# Patient Record
Sex: Male | Born: 1965
Health system: Southern US, Community
[De-identification: ages and names within clinical notes are randomized; demographics above are authoritative.]

## PROBLEM LIST (undated history)

## (undated) DIAGNOSIS — E119 Type 2 diabetes mellitus without complications: Secondary | ICD-10-CM

## (undated) DIAGNOSIS — I10 Essential (primary) hypertension: Secondary | ICD-10-CM

---

## 2015-11-26 ENCOUNTER — Inpatient Hospital Stay (HOSPITAL_COMMUNITY): Payer: BLUE CROSS/BLUE SHIELD

## 2015-11-26 ENCOUNTER — Encounter (HOSPITAL_COMMUNITY): Payer: Self-pay | Admitting: Emergency Medicine

## 2015-11-26 ENCOUNTER — Inpatient Hospital Stay (HOSPITAL_COMMUNITY)
Admission: EM | Admit: 2015-11-26 | Discharge: 2015-11-28 | DRG: 440 | Disposition: A | Discharge status: Home / Self Care | Payer: BLUE CROSS/BLUE SHIELD | Attending: Internal Medicine | Admitting: Internal Medicine

## 2015-11-26 DIAGNOSIS — E0865 Diabetes mellitus due to underlying condition with hyperglycemia: Secondary | ICD-10-CM | POA: Diagnosis present

## 2015-11-26 DIAGNOSIS — K858 Other acute pancreatitis without necrosis or infection: Secondary | ICD-10-CM

## 2015-11-26 DIAGNOSIS — E1165 Type 2 diabetes mellitus with hyperglycemia: Secondary | ICD-10-CM | POA: Diagnosis present

## 2015-11-26 DIAGNOSIS — R1011 Right upper quadrant pain: Secondary | ICD-10-CM | POA: Diagnosis not present

## 2015-11-26 DIAGNOSIS — K859 Acute pancreatitis without necrosis or infection, unspecified: Secondary | ICD-10-CM | POA: Diagnosis not present

## 2015-11-26 DIAGNOSIS — K853 Drug induced acute pancreatitis without necrosis or infection: Secondary | ICD-10-CM | POA: Diagnosis not present

## 2015-11-26 HISTORY — DX: Type 2 diabetes mellitus without complications: E11.9

## 2015-11-26 LAB — COMPREHENSIVE METABOLIC PANEL
ALT: 16 U/L — AB (ref 17–63)
AST: 16 U/L (ref 15–41)
Albumin: 4.4 g/dL (ref 3.5–5.0)
Alkaline Phosphatase: 63 U/L (ref 38–126)
Anion gap: 10 (ref 5–15)
BUN: 10 mg/dL (ref 6–20)
CHLORIDE: 97 mmol/L — AB (ref 101–111)
CO2: 28 mmol/L (ref 22–32)
CREATININE: 1.11 mg/dL (ref 0.61–1.24)
Calcium: 9.6 mg/dL (ref 8.9–10.3)
GFR calc Af Amer: >60 mL/min (ref >60)
GFR calc non Af Amer: >60 mL/min (ref >60)
Glucose, Bld: 284 mg/dL — ABNORMAL HIGH (ref 65–99)
Potassium: 4.5 mmol/L (ref 3.5–5.1)
SODIUM: 135 mmol/L (ref 135–145)
Total Bilirubin: 0.9 mg/dL (ref 0.3–1.2)
Total Protein: 7.7 g/dL (ref 6.5–8.1)

## 2015-11-26 LAB — URINALYSIS, ROUTINE W REFLEX MICROSCOPIC
BILIRUBIN URINE: NEGATIVE
Glucose, UA: >1000 mg/dL — AB
HGB URINE DIPSTICK: NEGATIVE
KETONES UR: 40 mg/dL — AB
Leukocytes, UA: NEGATIVE
Nitrite: NEGATIVE
PROTEIN: NEGATIVE mg/dL
Specific Gravity, Urine: 1.024 (ref 1.005–1.030)
pH: 6 (ref 5.0–8.0)

## 2015-11-26 LAB — RAPID URINE DRUG SCREEN, HOSP PERFORMED
AMPHETAMINES: NOT DETECTED
Barbiturates: NOT DETECTED
Benzodiazepines: NOT DETECTED
Cocaine: NOT DETECTED
OPIATES: NOT DETECTED
Tetrahydrocannabinol: NOT DETECTED

## 2015-11-26 LAB — GLUCOSE, CAPILLARY
GLUCOSE-CAPILLARY: 175 mg/dL — AB (ref 65–99)
GLUCOSE-CAPILLARY: 225 mg/dL — AB (ref 65–99)

## 2015-11-26 LAB — CBC
HCT: 42.4 % (ref 39.0–52.0)
Hemoglobin: 14.6 g/dL (ref 13.0–17.0)
MCH: 28.9 pg (ref 26.0–34.0)
MCHC: 34.4 g/dL (ref 30.0–36.0)
MCV: 83.8 fL (ref 78.0–100.0)
PLATELETS: 212 10*3/uL (ref 150–400)
RBC: 5.06 MIL/uL (ref 4.22–5.81)
RDW: 12.5 % (ref 11.5–15.5)
WBC: 11.6 10*3/uL — ABNORMAL HIGH (ref 4.0–10.5)

## 2015-11-26 LAB — URINE MICROSCOPIC-ADD ON: Bacteria, UA: NONE SEEN

## 2015-11-26 LAB — LIPASE, BLOOD: LIPASE: 164 U/L — AB (ref 11–51)

## 2015-11-26 MED ORDER — HYDROMORPHONE HCL 1 MG/ML IJ SOLN
0.5000 mg | Freq: Once | INTRAMUSCULAR | Status: AC
  Administered 2015-11-26: 0.5 mg via INTRAVENOUS
  Filled 2015-11-26: qty 1

## 2015-11-26 MED ORDER — INSULIN ASPART 100 UNIT/ML ~~LOC~~ SOLN
0.0000 [IU] | SUBCUTANEOUS | Status: DC
  Administered 2015-11-26 – 2015-11-27 (×3): 5 [IU] via SUBCUTANEOUS
  Administered 2015-11-27: 2 [IU] via SUBCUTANEOUS
  Administered 2015-11-27 – 2015-11-28 (×2): 3 [IU] via SUBCUTANEOUS
  Administered 2015-11-28 (×3): 2 [IU] via SUBCUTANEOUS

## 2015-11-26 MED ORDER — SODIUM CHLORIDE 0.9 % IV SOLN
INTRAVENOUS | Status: DC
  Administered 2015-11-26 – 2015-11-28 (×5): via INTRAVENOUS

## 2015-11-26 MED ORDER — ACETAMINOPHEN 325 MG PO TABS: 650.0000 mg | ORAL_TABLET | Freq: Four times a day (QID) | ORAL | Status: DC | PRN

## 2015-11-26 MED ORDER — ONDANSETRON HCL 4 MG/2ML IJ SOLN: 4.0000 mg | Freq: Four times a day (QID) | INTRAMUSCULAR | Status: DC | PRN

## 2015-11-26 MED ORDER — ENOXAPARIN SODIUM 40 MG/0.4ML ~~LOC~~ SOLN
40.0000 mg | SUBCUTANEOUS | Status: DC
  Administered 2015-11-26 – 2015-11-27 (×2): 40 mg via SUBCUTANEOUS
  Filled 2015-11-26 (×2): qty 0.4

## 2015-11-26 MED ORDER — SODIUM CHLORIDE 0.9 % IV BOLUS (SEPSIS)
1000.0000 mL | Freq: Once | INTRAVENOUS | Status: AC
  Administered 2015-11-26: 1000 mL via INTRAVENOUS

## 2015-11-26 MED ORDER — ONDANSETRON HCL 4 MG PO TABS
4.0000 mg | ORAL_TABLET | Freq: Four times a day (QID) | ORAL | Status: DC | PRN
Start: 2015-11-26 — End: 2015-11-28

## 2015-11-26 MED ORDER — OXYCODONE HCL 5 MG PO TABS: 5.0000 mg | ORAL_TABLET | ORAL | Status: DC | PRN

## 2015-11-26 MED ORDER — IOHEXOL 300 MG/ML  SOLN: 25.0000 mL | Freq: Once | INTRAMUSCULAR | Status: DC | PRN

## 2015-11-26 MED ORDER — MORPHINE SULFATE (PF) 2 MG/ML IV SOLN
2.0000 mg | INTRAVENOUS | Status: DC | PRN
  Administered 2015-11-26: 2 mg via INTRAVENOUS
  Filled 2015-11-26: qty 1

## 2015-11-26 MED ORDER — ACETAMINOPHEN 650 MG RE SUPP: 650.0000 mg | Freq: Four times a day (QID) | RECTAL | Status: DC | PRN

## 2015-11-26 MED ORDER — ONDANSETRON HCL 4 MG/2ML IJ SOLN
4.0000 mg | Freq: Once | INTRAMUSCULAR | Status: DC
  Filled 2015-11-26: qty 2

## 2015-11-26 NOTE — ED Notes (Signed)
Pt c/o right sided abdominal pain onset Monday. Last BM was yesterday and normal. Pt reports he normally has bowel movements daily. Before yesterday last BM was Monday.

## 2015-11-26 NOTE — Progress Notes (Signed)
NURSING PROGRESS NOTE  Mike BeersDavid [place] [id number] Admission Data: 11/26/2015 7:23 PM Attending Provider: [name], MD PCP:No primary care provider on file. Code Status: FULL   [name] is a 49 y.o. male patient admitted from ED:  -No acute distress noted.  -No complaints of shortness of breath.  -No complaints of chest pain.   Cardiac Monitoring: N/A  Blood pressure 141/85, pulse 95, temperature 99.2 F (37.3 C), temperature source Oral, resp. rate 19, height 5\' 11"  (1.803 m), weight 93.895 kg (207 lb), SpO2 100 %.   IV Fluids:  IV in place, occlusive dsg intact without redness, IV cath antecubital left, condition patent and no redness normal saline.   Allergies:  Shellfish allergy and Strawberry (diagnostic)  Past Medical History:   has a past medical history of Diabetes mellitus without complication (HCC).  Past Surgical History:   has no past surgical history on file.  Social History:   reports that he has never smoked. He does not have any smokeless tobacco history on file. He reports that he does not drink alcohol or use illicit drugs.  Skin: Intact  Patient/Family orientated to room. Information packet given to patient/family. Admission inpatient armband information verified with patient/family to include name and date of birth and placed on patient arm. Side rails up x 2, fall assessment and education completed with patient/family. Patient/family able to verbalize understanding of risk associated with falls and verbalized understanding to call for assistance before getting out of bed. Call light within reach. Patient/family able to voice and demonstrate understanding of unit orientation instructions.    Will continue to evaluate and treat per MD orders.  Bennie Pieriniyndi Berdena Cisek, RN

## 2015-11-26 NOTE — H&P (Signed)
[facility] History and Physical  [name] ZOX:[id number] DOB: 1966-04-15 DOA: 11/26/2015  Referring physician:  PCP: No primary care provider on file.   Chief Complaint: Abdominal pain  HPI: [name] is a 49 y.o. male with a past medical history of diabetes mellitus presented to the emergency department with complaints of right upper quadrant pain. [name] reporting that his right side/right upper quadrant region started hurting last Monday, becoming progressively became worse over the course of the week. He states that initially his pain was intermittent however has become constant, having an intensity of 6 out of 10, characterized as crampy. He reports attending a Christmas party yesterday evening and was unable to take any by mouth secondary to nausea. He denies emesis. He complains of subjective fevers, chills, intermittent diaphoresis. His wife stating he had a temperature of 99. He reports drinking alcohol twice a month. Denies hematemesis, bright red blood per rectum, diarrhea, black stools. Workup in the emergency room revealed a lipase of 164. His oxide phosphatase, AST and ALT were not elevated.                                       Review of Systems:  Constitutional:  No weight loss, night sweats, positive for subjective Fevers, chills, fatigue.  HEENT:  No headaches, Difficulty swallowing,Tooth/dental problems,Sore throat,  No sneezing, itching, ear ache, nasal congestion, post nasal drip,  Cardio-vascular:  No chest pain, Orthopnea, PND, swelling in lower extremities, anasarca, dizziness, palpitations  GI:  No heartburn, indigestion, positive for abdominal pain, nausea, denies vomiting, diarrhea, change in bowel habits, loss of appetite  Resp:  No shortness of breath with exertion or at rest. No excess mucus, no productive cough, No non-productive cough, No coughing up of blood.No change in color of mucus.No wheezing.No chest wall deformity  Skin:  no rash  or lesions.  GU:  no dysuria, change in color of urine, no urgency or frequency. No flank pain.  Musculoskeletal:  No joint pain or swelling. No decreased range of motion. No back pain.  Psych:  No change in mood or affect. No depression or anxiety. No memory loss.   Past Medical History  Diagnosis Date  . Diabetes mellitus without complication (HCC)    History reviewed. No pertinent past surgical history. Social History:  reports that he has never smoked. He does not have any smokeless tobacco history on file. He reports that he does not drink alcohol or use illicit drugs.  Allergies  Allergen Reactions  . Shellfish Allergy Itching and Other (See Comments)    Scratchy throat  . Strawberry (Diagnostic) Itching and Other (See Comments)    Scratchy throat    Family History  noncontributory  Prior to Admission medications   Not on File   Physical Exam: Filed Vitals:   11/26/15 1423 11/26/15 1542 11/26/15 1600 11/26/15 1700  BP: 152/102 152/105 144/102 145/96  Pulse: 102 105 102 101  Temp: 99.8 F (37.7 C)     TempSrc: Oral     Resp: Height:  (1.803 m)     Weight: 93.895 kg (207 lb)     SpO2: 100% 97% 97% 98%    Wt Readings from Last 3 Encounters:  11/26/15 93.895 kg (207 lb)    General:  Appears calm and comfortable, he is currently no acute distress, awake and alert, calm cooperative, pleasant Eyes:  PERRL, normal lids, irises & conjunctiva ENT: grossly normal hearing, lips & tongue Neck: no LAD, masses or thyromegaly Cardiovascular: RRR, no m/r/g. No LE edema. Telemetry: SR, no arrhythmias  Respiratory: CTA bilaterally, no w/r/r. Normal respiratory effort. Abdomen: His abdomen is soft however he has pain with palpation over the right upper quadrant and epigastric regions. There is no peritoneal signs. No rebound tenderness, guarding, had positive bowel sounds in all 4 quadrants. Skin: no rash or induration seen on limited exam Musculoskeletal:  grossly normal tone BUE/BLE Psychiatric: grossly normal mood and affect, speech fluent and appropriate Neurologic: grossly non-focal.          Labs on Admission:  Basic Metabolic Panel:  Recent Labs Lab 11/26/15 1541  NA 135  K 4.5  CL 97*  CO2 28  GLUCOSE 284*  BUN 10  CREATININE 1.11  CALCIUM 9.6   Liver Function Tests:  Recent Labs Lab 11/26/15 1541  AST 16  ALT 16*  ALKPHOS 63  BILITOT 0.9  PROT 7.7  ALBUMIN 4.4    Recent Labs Lab 11/26/15 1541  LIPASE 164*   No results for input(s): AMMONIA in the last 168 hours. CBC:  Recent Labs Lab 11/26/15 1541  WBC 11.6*  HGB 14.6  HCT 42.4  MCV 83.8  PLT 212   Cardiac Enzymes: No results for input(s): CKTOTAL, CKMB, CKMBINDEX, TROPONINI in the last 168 hours.  BNP (last 3 results) No results for input(s): BNP in the last 8760 hours.  ProBNP (last 3 results) No results for input(s): PROBNP in the last 8760 hours.  CBG: No results for input(s): GLUCAP in the last 168 hours.  Radiological Exams on Admission: No results found.  EKG: Independently reviewed.   Assessment/Plan Principal Problem:   Pancreatitis, acute Active Problems:   Diabetes mellitus due to underlying condition with hyperglycemia (HCC)   Acute pancreatitis   1. Acute pancreatitis. [name] is a pleasant 49 year old gentleman with history of diabetes presenting with complaints of right upper quadrant abdominal pain. He reported symptoms started last Monday. Lab work in the emergency room showing an elevated lipase of 164. He denies alcohol abuse. Still has his gallbladder. Will admit him to MedSurg, make him nothing by mouth for bowel rest, provide IV fluid resuscitation and narcotic analgesia for pain management. Will check a right upper quadrant ultrasound to assess for the gallstones as etiology of his acute pancreatitis. Will also check a fasting lipid panel. Repeat lipase level, comprehensive metabolic panel and CBC in a.m.   2. Diabetes mellitus. Lab work showing elevated glucose of 284. Hyperglycemia likely related to underlying inflammatory process. He'll be made nothing by mouth thus will provide sliding scale insulin with Accu-Cheks every 4 hours. Will check a hemoglobin A1c. 3. Hypertension. Systolic blood pressures in the emergency department in the 140s, could be related to pain symptoms. Will monitor blood pressures overnight  Code Status: Full code DVT Prophylaxis: Lovenox Family Communication: I spoke to his wife and sister were present at bedside Disposition Plan: Will admit patient to the inpatient service, anticipate he will require greater than 2 nights hospitalization  Time spent: 60 min  Jeralyn BennettZAMORA, Mike Sartin [facility] Pager [phone]

## 2015-11-26 NOTE — ED Provider Notes (Signed)
I saw and evaluated the patient, reviewed the resident's note and I agree with the findings and plan.   EKG Interpretation None       Pt is a 49 year old male presenting with abdominal pain- intiitally RLQ now LLQ, diffuse. No alcohol use. No fevers. Mild nausea. .Elevated lipase. Presumed pancreatitis.  Will get official RUQ US, admit.    EMERGENCY DEPARTMENT BILIARY ULTRASOUND INTERPRETATION "Study: Limited Abdominal Ultrasound of the gallbladder and common bile duct."  INDICATIONS: RUQ pain Indication: Multiple views of the gallbladder and common bile duct were obtained in real-time with a Multi-frequency probe." PERFORMED BY:  Myself IMAGES ARCHIVED?: Yes FINDINGS: Gallstones absent, Gallbladder wall normal in thickness and Sonographic Murphy's sign present LIMITATIONS: Body Habitus INTERPRETATION: Normal, there may be a stone right in the neck.  Will get formal. \    Nakeeta Sebastiani Randall AnLyn Jestina Stephani, MD 11/29/15 [phone]

## 2015-11-26 NOTE — ED Provider Notes (Signed)
CSN: [id number]     Arrival date & time 11/26/15  1412 History   First MD Initiated Contact with Patient 11/26/15 1516     Chief Complaint  Patient presents with  . Abdominal Pain  . Constipation    HPI   Mr. Christop Hippert is a 49 year old male with PMH of T2DM who presents with 1 week history of abdominal pain. Pain began on Monday (11/20/15), located at RUQ just under the rib cage. He describes it as a cramping, dull, ache that has been constant, now 5-6/10 in severity. He also reports constipation during this time. Usually has a bowel movement daily or every other day, but did not have one until yesterday, which was solid, normal size and without bright red blood or melena. He reports his pain also is now present at the left abdomen as well. He has had nausea with small food and fluid intake, but no emesis. He denies radiation of pain to the back. He reports diaphoresis, chills, and low grade fever with temperature of 14F taken at home. He took Aspirin which alleviated his pain. He initially thought his pain was a muscle strain and tried heating pads which was not helpful. He has not tried any laxatives. He denies any prior episodes of pancreatitis, gallstones, or similar symptoms. He denies smoking or illicit drug use. He reports occasional alcohol use, maybe 1-2 times a month.   Past Medical History  Diagnosis Date  . Diabetes mellitus without complication (HCC)    History reviewed. No pertinent past surgical history. No family history on file. Social History  Substance Use Topics  . Smoking status: Never Smoker   . Smokeless tobacco: None  . Alcohol Use: No    Review of Systems  Constitutional: Positive for diaphoresis. Negative for fever.  Eyes: Negative for visual disturbance.  Respiratory: Negative for cough, chest tightness, shortness of breath and wheezing.   Cardiovascular: Negative for chest pain and palpitations.  Gastrointestinal: Positive for nausea, abdominal pain,  constipation and abdominal distention. Negative for vomiting, diarrhea and blood in stool.  Genitourinary: Negative for dysuria and hematuria.  Musculoskeletal: Negative for myalgias and arthralgias.  Skin: Negative for color change and rash.  Neurological: Negative for dizziness, light-headedness and headaches.      Allergies  Review of patient's allergies indicates no known allergies.  Home Medications   Prior to Admission medications   Not on File   BP 144/102 mmHg  Pulse 102  Temp(Src) 99.8 F (37.7 C) (Oral)  Resp 16  Ht  (1.803 m)  Wt 93.895 kg  BMI 28.88 kg/m2  SpO2 97% Physical Exam  Constitutional: He is oriented to person, place, and time. He appears well-developed and well-nourished. No distress.  HENT:  Head: Normocephalic and atraumatic.  Eyes: No scleral icterus.  Cardiovascular: Regular rhythm.  Tachycardia present.   No murmur heard. Pulmonary/Chest: Effort normal. No respiratory distress. He has no wheezes. He has no rales. He exhibits no tenderness.  Abdominal: Soft. Bowel sounds are normal.  Generalized abdominal pain, worse on left quadrants and epigastric region. Mild right-sided CVA tenderness.  Neurological: He is alert and oriented to person, place, and time.  Skin: Skin is warm.  Psychiatric: He has a normal mood and affect.    ED Course  Procedures (including critical care time) Labs Review Labs Reviewed  LIPASE, BLOOD - Abnormal; Notable for the following:    Lipase 164 (*)    All other components within normal limits  COMPREHENSIVE  METABOLIC PANEL - Abnormal; Notable for the following:    Chloride 97 (*)    Glucose, Bld 284 (*)    ALT 16 (*)    All other components within normal limits  CBC - Abnormal; Notable for the following:    WBC 11.6 (*)    All other components within normal limits  URINALYSIS, ROUTINE W REFLEX MICROSCOPIC (NOT AT [facility]) - Abnormal; Notable for the following:    Glucose, UA >1000 (*)    Ketones, ur 40  (*)    All other components within normal limits  URINE MICROSCOPIC-ADD ON - Abnormal; Notable for the following:    Squamous Epithelial / LPF 0-5 (*)    All other components within normal limits    Imaging Review No results found. I have personally reviewed and evaluated these images and lab results as part of my medical decision-making.   EKG Interpretation None      MDM   Final diagnoses:  RUQ abdominal pain   Mr. Dinah BeersDavid Linney is a 49 year old male with PMH of T2DM who presents with 1 week history of abdominal pain. Initially right-sided, now pain involves left abdomen as well. He is tender to palpation on exam. He reports a 5 day history of constipation prior to bowel movement yesterday which did not provide relief of his pain. He has a low-grade fever, mild leukocytosis of 11.6, and is hypertensive and tachycardic. Lipase returns elevated at 164. Limited Bedside U/S was done by Dr. Corlis LeakMackuen with possible stone seen in neck, normal gallbladder wall thickness, and positive Sonographic's Murphy sign. Discussed with Hospitalist who will admit for pancreatitis and official RUQ U/S ordered.    Darreld McleanVishal Myiah Petkus, MD 11/26/15 1734  Courteney Randall AnLyn Mackuen, MD 11/30/15 [id number]

## 2015-11-27 DIAGNOSIS — K853 Drug induced acute pancreatitis without necrosis or infection: Secondary | ICD-10-CM

## 2015-11-27 LAB — LIPID PANEL
CHOL/HDL RATIO: 4 ratio
Cholesterol: 136 mg/dL (ref 0–200)
HDL: 34 mg/dL — AB (ref >40)
LDL CALC: 82 mg/dL (ref 0–99)
Triglycerides: 101 mg/dL (ref <150)
VLDL: 20 mg/dL (ref 0–40)

## 2015-11-27 LAB — COMPREHENSIVE METABOLIC PANEL
ALBUMIN: 3.5 g/dL (ref 3.5–5.0)
ALK PHOS: 58 U/L (ref 38–126)
ALT: 13 U/L — ABNORMAL LOW (ref 17–63)
ANION GAP: 8 (ref 5–15)
AST: 15 U/L (ref 15–41)
BILIRUBIN TOTAL: 0.9 mg/dL (ref 0.3–1.2)
BUN: 11 mg/dL (ref 6–20)
CO2: 29 mmol/L (ref 22–32)
Calcium: 8.9 mg/dL (ref 8.9–10.3)
Chloride: 102 mmol/L (ref 101–111)
Creatinine, Ser: 1 mg/dL (ref 0.61–1.24)
GFR calc Af Amer: >60 mL/min (ref >60)
GFR calc non Af Amer: >60 mL/min (ref >60)
GLUCOSE: 112 mg/dL — AB (ref 65–99)
POTASSIUM: 3.9 mmol/L (ref 3.5–5.1)
SODIUM: 139 mmol/L (ref 135–145)
TOTAL PROTEIN: 6.7 g/dL (ref 6.5–8.1)

## 2015-11-27 LAB — CBC
HEMATOCRIT: 38.2 % — AB (ref 39.0–52.0)
HEMOGLOBIN: 12.8 g/dL — AB (ref 13.0–17.0)
MCH: 28.3 pg (ref 26.0–34.0)
MCHC: 33.5 g/dL (ref 30.0–36.0)
MCV: 84.5 fL (ref 78.0–100.0)
Platelets: 217 10*3/uL (ref 150–400)
RBC: 4.52 MIL/uL (ref 4.22–5.81)
RDW: 12.4 % (ref 11.5–15.5)
WBC: 8.9 10*3/uL (ref 4.0–10.5)

## 2015-11-27 LAB — GLUCOSE, CAPILLARY
GLUCOSE-CAPILLARY: 138 mg/dL — AB (ref 65–99)
GLUCOSE-CAPILLARY: 108 mg/dL — AB (ref 65–99)
GLUCOSE-CAPILLARY: 107 mg/dL — AB (ref 65–99)
GLUCOSE-CAPILLARY: 235 mg/dL — AB (ref 65–99)
GLUCOSE-CAPILLARY: 209 mg/dL — AB (ref 65–99)

## 2015-11-27 LAB — HEMOGLOBIN A1C
Hgb A1c MFr Bld: 9.6 % — ABNORMAL HIGH (ref 4.8–5.6)
Mean Plasma Glucose: 229 mg/dL

## 2015-11-27 LAB — LIPASE, BLOOD: Lipase: 54 U/L — ABNORMAL HIGH (ref 11–51)

## 2015-11-27 MED ORDER — METFORMIN HCL 500 MG PO TABS
1000.0000 mg | ORAL_TABLET | Freq: Two times a day (BID) | ORAL | Status: DC
Start: 2015-11-27 — End: 2015-11-27

## 2015-11-27 MED ORDER — METFORMIN HCL 500 MG PO TABS
1000.0000 mg | ORAL_TABLET | Freq: Two times a day (BID) | ORAL | Status: DC
  Administered 2015-11-27 – 2015-11-28 (×2): 1000 mg via ORAL
  Filled 2015-11-27 (×2): qty 2

## 2015-11-27 NOTE — Progress Notes (Signed)
[facility] PROGRESS NOTE  [name] ZOX:[id number] DOB: [birth year] DOA: 11/26/2015 PCP: No primary care provider on file.  Assessment/Plan: 1. Acute pancreatitis -Patient is a pleasant 49 year old gentleman with history of diabetes mellitus presented with right upper quadrant abdominal pain. Labs revealed lipase level of 164. Workup included abdominal ultrasound that only revealed trace sludge without evidence of gallstones with common bile duct measuring 3 mm. -On 11/27/2015 lab work showing improvement to his lipase level at 54. -Fasting lipid panel showing a triglyceride level of 101. -He denies out call abuse. -Suspect acute pancreatitis secondary to Januvia which has been discontinued. -Given significant clinical improvement will advance his diet to clears today.  2.  Type 2 diabetes mellitus -As mentioned above I suspicious that Januvia may have caused acute pancreatitis and has been discontinued. -Blood sugars elevated, will restart his metformin at 1000 mg by mouth twice a day.  Code Status: Full code Family Communication: Spoke to his wife Disposition Plan: Anticipate discharge in the next 14 hours if he continues to show clinical improvement    HPI/Subjective: [name] is a pleasant 49 year old gentleman with a past medical history of diabetes mellitus was admitted to medicine service on 11/26/2015 when he was in with complaints of right upper quadrant abdominal pain. Lab work in the emergency department revealed elevated lipase of 164. He was admitted for acute pancreatitis. Further workup included a right upper quadrant ultrasound that only revealed trace sludge, no evidence of gallstones with common bile duct measuring 3 mm. On the following day lipase levels coming down to 54. He reported significant improvements and denied further abdominal pain. Lipid panel revealed a triglyceride level of 101. He denied alcohol abuse. He had been on Januvia 1000 mg by mouth daily  which may have been the cause of his pancreatitis. This medication was discontinued on admission.  Objective: Filed Vitals:   11/27/15 0509 11/27/15 1333  BP: 137/83 119/72  Pulse: 96 93  Temp: 99 F (37.2 C) 99.9 F (37.7 C)  Resp: 18 20    Intake/Output Summary (Last 24 hours) at 11/27/15 1721 Last data filed at 11/27/15 1308  Gross per 24 hour  Intake 1995.84 ml  Output      0 ml  Net 1995.84 ml   Filed Weights   11/26/15 1423  Weight: 93.895 kg (207 lb)    Exam:   General:  No acute distress, patient is awake alert, states feeling much better.  Cardiovascular: Regular rate and rhythm normal S1-S2 no murmurs gallops  Respiratory: Normal respiratory effort  Abdomen: Significant improvement to abdominal examination, soft nontender nondistended  Musculoskeletal: No edema  Data Reviewed: Basic Metabolic Panel:  Recent Labs Lab 11/26/15 1541 11/27/15 0859  NA 135 139  K 4.5 3.9  CL 97* 102  CO2 28 29  GLUCOSE 284* 112*  BUN 10 11  CREATININE 1.11 1.00  CALCIUM 9.6 8.9   Liver Function Tests:  Recent Labs Lab 11/26/15 1541 11/27/15 0859  AST 16 15  ALT 16* 13*  ALKPHOS 63 58  BILITOT 0.9 0.9  PROT 7.7 6.7  ALBUMIN 4.4 3.5    Recent Labs Lab 11/26/15 1541 11/27/15 0859  LIPASE 164* 54*   No results for input(s): AMMONIA in the last 168 hours. CBC:  Recent Labs Lab 11/26/15 1541 11/27/15 0859  WBC 11.6* 8.9  HGB 14.6 12.8*  HCT 42.4 38.2*  MCV 83.8 84.5  PLT 212 217   Cardiac Enzymes: No results for input(s): CKTOTAL, CKMB, CKMBINDEX, TROPONINI  in the last 168 hours. BNP (last 3 results) No results for input(s): BNP in the last 8760 hours.  ProBNP (last 3 results) No results for input(s): PROBNP in the last 8760 hours.  CBG:  Recent Labs Lab 11/26/15 2359 11/27/15 0413 11/27/15 0801 11/27/15 1149 11/27/15 1702  GLUCAP 175* 138* 108* 107* 235*    No results found for this or any previous visit (from the past 240  hour(s)).   Studies: [place] Abdomen Complete  11/26/2015  CLINICAL DATA:  Acute pancreatitis EXAM: ULTRASOUND ABDOMEN COMPLETE COMPARISON:  None. FINDINGS: Gallbladder:  Trace sludge, with no wall thickening or tenderness Common bile duct: Diameter: 3mm Liver: No focal lesion identified. Mildly coarse echogenicity suggesting steatosis IVC: No abnormality visualized. Pancreas:  Obscured by bowel gas Spleen: Size and appearance within normal limits. Right Kidney: Length: 12cm. Echogenicity within normal limits. No mass or hydronephrosis visualized. Left Kidney: Length: 12cm. Echogenicity within normal limits. No mass or hydronephrosis visualized. Abdominal aorta: No aneurysm visualized. Other findings:  Trace ascites noted. IMPRESSION: Trace ascites.  Hepatic steatosis.  Pancreas not visualized. Electronically Signed   By: Esperanza Heiraymond  Rubner M.D.   On: 11/26/2015 21:04    Scheduled Meds: . enoxaparin (LOVENOX) injection  40 mg Subcutaneous Q24H  . insulin aspart  0-15 Units Subcutaneous 6 times per day  . metFORMIN  1,000 mg Oral BID WC   Continuous Infusions: . sodium chloride 125 mL/hr at 11/27/15 1248    Principal Problem:   Pancreatitis, acute Active Problems:   Diabetes mellitus due to underlying condition with hyperglycemia (HCC)   Acute pancreatitis    Time spent: 25 min    Jeralyn BennettZAMORA, Atasha Colebank  [facility] Pager [phone]. If 7PM-7AM, please contact night-coverage at [email or web address], password [email or web address] 11/27/2015, 5:21 PM  LOS: 1 day

## 2015-11-28 LAB — GLUCOSE, CAPILLARY
Glucose-Capillary: 124 mg/dL — ABNORMAL HIGH (ref 65–99)
Glucose-Capillary: 126 mg/dL — ABNORMAL HIGH (ref 65–99)
Glucose-Capillary: 142 mg/dL — ABNORMAL HIGH (ref 65–99)
Glucose-Capillary: 167 mg/dL — ABNORMAL HIGH (ref 65–99)

## 2015-11-28 LAB — CBC
HEMATOCRIT: 36.3 % — AB (ref 39.0–52.0)
Hemoglobin: 12.3 g/dL — ABNORMAL LOW (ref 13.0–17.0)
MCH: 28.5 pg (ref 26.0–34.0)
MCHC: 33.9 g/dL (ref 30.0–36.0)
MCV: 84.2 fL (ref 78.0–100.0)
Platelets: 187 10*3/uL (ref 150–400)
RBC: 4.31 MIL/uL (ref 4.22–5.81)
RDW: 12.3 % (ref 11.5–15.5)
WBC: 6 10*3/uL (ref 4.0–10.5)

## 2015-11-28 LAB — LIPASE, BLOOD: Lipase: 39 U/L (ref 11–51)

## 2015-11-28 MED ORDER — INSULIN GLARGINE 100 UNITS/ML SOLOSTAR PEN: 14.0000 [IU] | PEN_INJECTOR | Freq: Every day | SUBCUTANEOUS | Status: DC

## 2015-11-28 NOTE — Care Management Note (Signed)
Case Management Note  Patient Details  Name: Mike BeersDavid Gomez MRN: [id number] Date of Birth: [birth year]  Subjective/Objective:                  Date-11-28-15 Initial Assessment Spoke with patient at the bedside Introduced self as case manager and explained role in discharge planning and how to be reached.  [place]. Verified patient anticipates to go home with spouse at time of discharge.  Patient has no DME. Expressed potential need for no other DME.  Patient denied needing help with their medication.  Patient drives to MD appointments.  Verified patient has PCP [facility]. Patient states they currently receive HH services through no one.    Plan: CM will continue to follow [facility].   Lawerance Sabalebbie Rilla Buckman RN BSN CM [phone]   Action/Plan:  NO CM needs identified, will DC today to home, self care.  Expected Discharge Date:                  Expected Discharge Plan:  Home/Self Care  In-House Referral:     Discharge planning Services  CM Consult  Post Acute Care Choice:    Choice offered to:     DME Arranged:    DME Agency:     HH Arranged:    HH Agency:     Status of Service:  Completed, signed off  Medicare Important Message Given:    Date Medicare IM Given:    Medicare IM give by:    Date Additional Medicare IM Given:    Additional Medicare Important Message give by:     If discussed at Long Length of Stay Meetings, dates discussed:    Additional Comments:  Lawerance SabalDebbie Leya Paige, RN 11/28/2015, 11:39 AM

## 2015-11-28 NOTE — Discharge Summary (Addendum)
Physician Discharge Summary  [name] ZOX:[id number] DOB: [birth year] DOA: 11/26/2015  PCP: No primary care provider on file.  Admit date: 11/26/2015 Discharge date: 11/28/2015  Time spent: 35 minutes  Recommendations for Outpatient Follow-up:  1. Patient admitted for pancreatitis, suspect related to Januvia. This medication was stopped. I instructd him to increase his Lantus to 14 units and continue Metformin at 1000 mg PO BID. Please follow up on blood sugars.    Discharge Diagnoses:  Principal Problem:   Pancreatitis, acute Active Problems:   Diabetes mellitus due to underlying condition with hyperglycemia (HCC)   Acute pancreatitis   Discharge Condition: Stable  Diet recommendation: Diabetic Diet  Filed Weights   11/26/15 1423  Weight: 93.895 kg (207 lb)    History of present illness:  vid [name] is a 49 y.o. male with a past medical history of diabetes mellitus presented to the emergency department with complaints of right upper quadrant pain. [name] reporting that his right side/right upper quadrant region started hurting last Monday, becoming progressively became worse over the course of the week. He states that initially his pain was intermittent however has become constant, having an intensity of 6 out of 10, characterized as crampy. He reports attending a Christmas party yesterday evening and was unable to take any by mouth secondary to nausea. He denies emesis. He complains of subjective fevers, chills, intermittent diaphoresis. His wife stating he had a temperature of 99. He reports drinking alcohol twice a month. Denies hematemesis, bright red blood per rectum, diarrhea, black stools. Workup in the emergency room revealed a lipase of 164. His oxide phosphatase, AST and ALT were not elevated.  Hospital Course:  [name] is a pleasant 49 year old gentleman with a past medical history of diabetes mellitus was admitted to medicine service on 11/26/2015  when he was in with complaints of right upper quadrant abdominal pain. Lab work in the emergency department revealed elevated lipase of 164. He was admitted for acute pancreatitis. Further workup included a right upper quadrant ultrasound that only revealed trace sludge, no evidence of gallstones with common bile duct measuring 3 mm. On the following day lipase levels coming down to 54. He reported significant improvements and denied further abdominal pain. Lipid panel revealed a triglyceride level of 101. He denied alcohol abuse. He had been on Januvia 1000 mg by mouth daily which may have been the cause of his pancreatitis. This medication was discontinued on admission. On 11/28/2015 his lipase level normalized to 39. He was tolerating regular diet and reported resolution to his abdominal pain. He was discharged home on 11/28/2015. I instructed him to stop Januvia and increase Lantus to 14 units Benoit q hs. Please follow up on blood sugars.    Discharge Exam: Filed Vitals:   11/27/15 2154 11/28/15 0525  BP: 123/60 128/76  Pulse: 95 77  Temp: 99.3 F (37.4 C) 98.9 F (37.2 C)  Resp: 18 18     General: No acute distress, patient is awake alert, wants to go home  Cardiovascular: Regular rate and rhythm normal S1-S2 no murmurs gallops  Respiratory: Normal respiratory effort  Abdomen: Significant improvement to abdominal examination, soft nontender nondistended  Musculoskeletal: No edema  Discharge Instructions   Discharge Instructions    Call MD for:  difficulty breathing, headache or visual disturbances    Complete by:  As directed      Call MD for:  extreme fatigue    Complete by:  As directed      Call  MD for:  hives    Complete by:  As directed      Call MD for:  persistant dizziness or light-headedness    Complete by:  As directed      Call MD for:  persistant nausea and vomiting    Complete by:  As directed      Call MD for:  redness, tenderness, or signs of infection (pain,  swelling, redness, odor or green/yellow discharge around incision site)    Complete by:  As directed      Call MD for:  severe uncontrolled pain    Complete by:  As directed      Call MD for:  temperature >100.4    Complete by:  As directed      Call MD for:    Complete by:  As directed      Diet - low sodium heart healthy    Complete by:  As directed      Increase activity slowly    Complete by:  As directed           Current Discharge Medication List    CONTINUE these medications which have CHANGED   Details  insulin glargine (LANTUS) 100 unit/mL SOPN Inject 0.14 mLs (14 Units total) into the skin at bedtime. Qty: 15 mL, Refills: 11      CONTINUE these medications which have NOT CHANGED   Details  aspirin EC 81 MG tablet Take 81 mg by mouth daily.    glipiZIDE (GLUCOTROL) 10 MG tablet Take 20 mg by mouth daily before supper.    lisinopril (PRINIVIL,ZESTRIL) 10 MG tablet Take 10 mg by mouth daily.    metFORMIN (GLUCOPHAGE) 1000 MG tablet Take 1,000 mg by mouth 2 (two) times daily with a meal.    Multiple Vitamin (MULTIVITAMIN WITH MINERALS) TABS tablet Take 1 tablet by mouth daily at 12 noon.      STOP taking these medications     Aspirin-Caffeine (ANACIN PO)      sitaGLIPtin (JANUVIA) 100 MG tablet        Allergies  Allergen Reactions  . Shellfish Allergy Itching and Other (See Comments)    Scratchy throat  . Strawberry (Diagnostic) Itching and Other (See Comments)    Scratchy throat   Follow-up Information    Follow up with POLITE,RONALD D, MD In 2 weeks.   Specialty:  Internal Medicine   Contact information:   [address] [place] [address] [phone]        The results of significant diagnostics from this hospitalization (including imaging, microbiology, ancillary and laboratory) are listed below for reference.    Significant Diagnostic Studies: US Abdomen Complete  11/26/2015  CLINICAL DATA:  Acute pancreatitis EXAM:  ULTRASOUND ABDOMEN COMPLETE COMPARISON:  None. FINDINGS: Gallbladder:  Trace sludge, with no wall thickening or tenderness Common bile duct: Diameter: 3mm Liver: No focal lesion identified. Mildly coarse echogenicity suggesting steatosis IVC: No abnormality visualized. Pancreas:  Obscured by bowel gas Spleen: Size and appearance within normal limits. Right Kidney: Length: 12cm. Echogenicity within normal limits. No mass or hydronephrosis visualized. Left Kidney: Length: 12cm. Echogenicity within normal limits. No mass or hydronephrosis visualized. Abdominal aorta: No aneurysm visualized. Other findings:  Trace ascites noted. IMPRESSION: Trace ascites.  Hepatic steatosis.  Pancreas not visualized. Electronically Signed   By: Esperanza Heir M.D.   On: 11/26/2015 21:04    Microbiology: No results found for this or any previous visit (from the past 240 hour(s)).  Labs: Basic Metabolic Panel:  Recent Labs Lab 11/26/15 1541 11/27/15 0859  NA 135 139  K 4.5 3.9  CL 97* 102  CO2 28 29  GLUCOSE 284* 112*  BUN 10 11  CREATININE 1.11 1.00  CALCIUM 9.6 8.9   Liver Function Tests:  Recent Labs Lab 11/26/15 1541 11/27/15 0859  AST 16 15  ALT 16* 13*  ALKPHOS 63 58  BILITOT 0.9 0.9  PROT 7.7 6.7  ALBUMIN 4.4 3.5    Recent Labs Lab 11/26/15 1541 11/27/15 0859 11/28/15 0656  LIPASE 164* 54* 39   No results for input(s): AMMONIA in the last 168 hours. CBC:  Recent Labs Lab 11/26/15 1541 11/27/15 0859 11/28/15 0656  WBC 11.6* 8.9 6.0  HGB 14.6 12.8* 12.3*  HCT 42.4 38.2* 36.3*  MCV 83.8 84.5 84.2  PLT 212 217 187   Cardiac Enzymes: No results for input(s): CKTOTAL, CKMB, CKMBINDEX, TROPONINI in the last 168 hours. BNP: BNP (last 3 results) No results for input(s): BNP in the last 8760 hours.  ProBNP (last 3 results) No results for input(s): PROBNP in the last 8760 hours.  CBG:  Recent Labs Lab 11/27/15 1702 11/27/15 2021 11/28/15 0012 11/28/15 0428  11/28/15 0814  GLUCAP 235* 209* 142* 126* 124*       Signed:  Jeralyn BennettZAMORA, Waldron Gerry  [facility] 11/28/2015, 11:13 AM

## 2015-11-28 NOTE — Progress Notes (Signed)
RN reviewed and given patient discharge instruction using teach back. Pt had no questions or concerns when asked. Pt is dressing and sitting up in chair eating lunch at this time. Wife in room gathering pt's belongings. RN removed IV site with catheter tip intact. Dressing in place. No signs or symptoms of infection or bleeding noted. Pt was educated to call RN when pt is ready for wheelchair escort. Call bell and personal belonging within reach.

## 2016-11-11 ENCOUNTER — Encounter (HOSPITAL_COMMUNITY): Payer: Self-pay

## 2016-11-11 ENCOUNTER — Emergency Department (HOSPITAL_COMMUNITY)
Admission: EM | Admit: 2016-11-11 | Discharge: 2016-11-12 | Disposition: A | Discharge status: Home / Self Care | Payer: BLUE CROSS/BLUE SHIELD | Attending: Emergency Medicine | Admitting: Emergency Medicine

## 2016-11-11 DIAGNOSIS — Z7982 Long term (current) use of aspirin: Secondary | ICD-10-CM | POA: Diagnosis not present

## 2016-11-11 DIAGNOSIS — Y929 Unspecified place or not applicable: Secondary | ICD-10-CM | POA: Insufficient documentation

## 2016-11-11 DIAGNOSIS — Y999 Unspecified external cause status: Secondary | ICD-10-CM | POA: Insufficient documentation

## 2016-11-11 DIAGNOSIS — Z79899 Other long term (current) drug therapy: Secondary | ICD-10-CM | POA: Diagnosis not present

## 2016-11-11 DIAGNOSIS — X58XXXA Exposure to other specified factors, initial encounter: Secondary | ICD-10-CM | POA: Diagnosis not present

## 2016-11-11 DIAGNOSIS — T162XXA Foreign body in left ear, initial encounter: Secondary | ICD-10-CM | POA: Insufficient documentation

## 2016-11-11 DIAGNOSIS — Y939 Activity, unspecified: Secondary | ICD-10-CM | POA: Diagnosis not present

## 2016-11-11 DIAGNOSIS — I10 Essential (primary) hypertension: Secondary | ICD-10-CM | POA: Insufficient documentation

## 2016-11-11 DIAGNOSIS — E119 Type 2 diabetes mellitus without complications: Secondary | ICD-10-CM | POA: Insufficient documentation

## 2016-11-11 DIAGNOSIS — Z794 Long term (current) use of insulin: Secondary | ICD-10-CM | POA: Insufficient documentation

## 2016-11-11 HISTORY — DX: Essential (primary) hypertension: I10

## 2016-11-11 NOTE — ED Provider Notes (Signed)
[facility]-EMERGENCY DEPT Provider Note   CSN: [id number] Arrival date & time: 11/11/16  2241  By signing my name below, I, [name], attest that this documentation has been prepared under the direction and in the presence of [name], PA-C.  Electronically Signed: Rosario AdieWilliam Andrew Gomez, ED Scribe. 11/11/16. 11:56 PM.  History   Chief Complaint Chief Complaint  Patient presents with  . bug in ear   The history is provided by the patient and the spouse. No language interpreter was used.    HPI Comments: [name] is a 50 y.o. male with a PMHx of DM and HTN, who presents to the Emergency Department complaining of persistent sensation of foreign body to the left ear w/ associated ear pain which began last night. Pt reports that he has felt intermittent "crackling" sensation to the ear since onset, and tonight his wife looked into his ear and saw "something moving". His wife attempted to extract the foreign body prior to coming into the ED without success. He denies fever, congestion, rhinorrhea, or any other associated symptoms.    Past Medical History:  Diagnosis Date  . Diabetes mellitus without complication (HCC)   . Hypertension    Patient Active Problem List   Diagnosis Date Noted  . Pancreatitis, acute 11/26/2015  . Diabetes mellitus due to underlying condition with hyperglycemia (HCC) 11/26/2015  . Acute pancreatitis 11/26/2015   History reviewed. No pertinent surgical history.  Home Medications    Prior to Admission medications   Medication Sig Start Date End Date Taking? Authorizing Provider  aspirin EC 81 MG tablet Take 81 mg by mouth daily.    Historical Provider, MD  glipiZIDE (GLUCOTROL) 10 MG tablet Take 20 mg by mouth daily before supper.    Historical Provider, MD  insulin glargine (LANTUS) 100 unit/mL SOPN Inject 0.14 mLs (14 Units total) into the skin at bedtime. 11/28/15   [name], MD  lisinopril (PRINIVIL,ZESTRIL) 10 MG tablet Take 10 mg by  mouth daily.    Historical Provider, MD  metFORMIN (GLUCOPHAGE) 1000 MG tablet Take 1,000 mg by mouth 2 (two) times daily with a meal.    Historical Provider, MD  Multiple Vitamin (MULTIVITAMIN WITH MINERALS) TABS tablet Take 1 tablet by mouth daily at 12 noon.    Historical Provider, MD  ofloxacin (FLOXIN) 0.3 % otic solution Place 10 drops into the left ear 2 (two) times daily. 11/12/16   [name], PA-C   Family History History reviewed. No pertinent family history.  Social History Social History  Substance Use Topics  . Smoking status: Never Smoker  . Smokeless tobacco: Never Used  . Alcohol use No   Allergies   Shellfish allergy and Strawberry (diagnostic)  Review of Systems Review of Systems  Constitutional: Negative for fever.  HENT: Positive for ear pain. Negative for congestion and rhinorrhea.    Physical Exam Updated Vital Signs BP 161/92 (BP Location: Left Arm)   Pulse 83   Temp 97.5 F (36.4 C) (Oral)   Resp 18   Ht 5\' 11"  (1.803 m)   Wt 210 lb (95.3 kg)   SpO2 100%   BMI 29.29 kg/m   Physical Exam  Constitutional: He appears well-developed and well-nourished. No distress.  HENT:  Head: Normocephalic and atraumatic.  Right Ear: Tympanic membrane, external ear and ear canal normal.  Left Ear: A foreign body is present. Tympanic membrane is perforated. Tympanic membrane is not injected. No hemotympanum.  Small perforation at 7 o'clock position of  left TM. Live insect at base of left external auditory canal,   Eyes: Conjunctivae and EOM are normal. Pupils are equal, round, and reactive to light. Right eye exhibits no discharge. Left eye exhibits no discharge. No scleral icterus.  Neck: Normal range of motion.  Pulmonary/Chest: Effort normal. No respiratory distress.  Abdominal: He exhibits no distension.  Neurological: He is alert.  Skin: Skin is warm and dry. He is not diaphoretic.  Psychiatric: He has a normal mood and affect. His behavior is  normal.   ED Treatments / Results  DIAGNOSTIC STUDIES: Oxygen Saturation is 100% on RA, normal by my interpretation.   COORDINATION OF CARE: 11:56 PM-Discussed next steps with pt. Pt verbalized understanding and is agreeable with the plan.   Labs (all labs ordered are listed, but only abnormal results are displayed) Labs Reviewed - No data to display  EKG  EKG Interpretation None      Radiology No results found.  Procedures Procedures   Medications Ordered in ED Medications  lidocaine (PF) (XYLOCAINE) 1 % injection 5 mL (5 mLs Other Given 11/12/16 0054)    Initial Impression / Assessment and Plan / ED Course  I have reviewed the triage vital signs and the nursing notes.  Pertinent labs & imaging results that were available during my care of the patient were reviewed by me and considered in my medical decision making (see chart for details).  Clinical Course    Patient presents to ED with complaint of foreign body in ear. Patient is afebrile and non-toxic appearing in NAD. VSS. Live insect noted in left external auditory canal with small TM perforation at 7 o'clock position. Shared visit with Dr. Blinda LeatherwoodPollina. Manual removal with alligator forceps attempted by Dr. Blinda LeatherwoodPollina. Lidocaine was then placed in ear canal and insect walked out of ear canal. Bleeding noted to external auditory canal following. Given small TM perforation will place pt on topical otic ABX ofloxacin. Encouraged follow up with PCP or ENT for re-evaluation in 2-3 days. Return precautions given. Pt voiced understanding and is agreeable.   Final Clinical Impressions(s) / ED Diagnoses   Final diagnoses:  Foreign body of left ear, initial encounter   New Prescriptions Discharge Medication List as of 11/12/2016  1:33 AM    START taking these medications   Details  ofloxacin (FLOXIN) 0.3 % otic solution Place 10 drops into the left ear 2 (two) times daily., Starting Tue 11/12/2016, Print       I personally  performed the services described in this documentation, which was scribed in my presence. The recorded information has been reviewed and is accurate.     [name], PA-C 11/12/16 0220    Gilda Creasehristopher J Pollina, MD 11/12/16 [phone]

## 2016-11-11 NOTE — ED Triage Notes (Signed)
Pt states felt like something was in L ear. Pt states wife looked into ear and saw a bug.

## 2016-11-12 MED ORDER — LIDOCAINE HCL (PF) 1 % IJ SOLN
5.0000 mL | Freq: Once | INTRAMUSCULAR | Status: AC
  Administered 2016-11-12: 5 mL
  Filled 2016-11-12: qty 5

## 2016-11-12 MED ORDER — OFLOXACIN 0.3 % OT SOLN: 10.0000 [drp] | Freq: Two times a day (BID) | OTIC | 0 refills | Status: DC

## 2016-11-12 NOTE — ED Notes (Signed)
After placing lidocaine in pt ear pt has a bug (earwick) come out.

## 2016-11-12 NOTE — Discharge Instructions (Signed)
Read the information below.  An earwig was removed from your ear. There appears to be a small perforation of your ear drum. You are being placed on topical antibiotic drops. Please use as directed.  You can take tylenol or motrin for pain relief.  Please follow up with your primary provider or ear, nose, and throat (ENT). doctor in the next few days for re-evaluation. I have provided the contact information for ENT.  Use the prescribed medication as directed.  Please discuss all new medications with your pharmacist.   You may return to the Emergency Department at any time for worsening condition or any new symptoms that concern you.

## 2016-11-13 ENCOUNTER — Other Ambulatory Visit: Payer: Self-pay | Admitting: Gastroenterology

## 2017-01-13 ENCOUNTER — Ambulatory Visit (HOSPITAL_COMMUNITY)
Admission: RE | Admit: 2017-01-13 | Discharge: 2017-01-13 | Disposition: A | Discharge status: Home / Self Care | Payer: BLUE CROSS/BLUE SHIELD | Source: Ambulatory Visit | Attending: Gastroenterology | Admitting: Gastroenterology

## 2017-01-13 ENCOUNTER — Ambulatory Visit (HOSPITAL_COMMUNITY): Payer: BLUE CROSS/BLUE SHIELD | Admitting: Anesthesiology

## 2017-01-13 ENCOUNTER — Encounter (HOSPITAL_COMMUNITY): Payer: Self-pay | Admitting: *Deleted

## 2017-01-13 ENCOUNTER — Encounter (HOSPITAL_COMMUNITY)
Admission: RE | Disposition: A | Discharge status: Home / Self Care | Payer: Self-pay | Source: Ambulatory Visit | Attending: Gastroenterology

## 2017-01-13 DIAGNOSIS — I10 Essential (primary) hypertension: Secondary | ICD-10-CM | POA: Insufficient documentation

## 2017-01-13 DIAGNOSIS — Z1211 Encounter for screening for malignant neoplasm of colon: Secondary | ICD-10-CM | POA: Diagnosis present

## 2017-01-13 DIAGNOSIS — E119 Type 2 diabetes mellitus without complications: Secondary | ICD-10-CM | POA: Diagnosis not present

## 2017-01-13 DIAGNOSIS — E7800 Pure hypercholesterolemia, unspecified: Secondary | ICD-10-CM | POA: Insufficient documentation

## 2017-01-13 DIAGNOSIS — K859 Acute pancreatitis without necrosis or infection, unspecified: Secondary | ICD-10-CM | POA: Diagnosis not present

## 2017-01-13 DIAGNOSIS — Z794 Long term (current) use of insulin: Secondary | ICD-10-CM | POA: Insufficient documentation

## 2017-01-13 HISTORY — PX: COLONOSCOPY WITH PROPOFOL: SHX5780

## 2017-01-13 LAB — GLUCOSE, CAPILLARY: Glucose-Capillary: 184 mg/dL — ABNORMAL HIGH (ref 65–99)

## 2017-01-13 SURGERY — COLONOSCOPY WITH PROPOFOL
Anesthesia: Monitor Anesthesia Care

## 2017-01-13 MED ORDER — PROPOFOL 500 MG/50ML IV EMUL
INTRAVENOUS | Status: DC | PRN
  Administered 2017-01-13: 100 ug/kg/min via INTRAVENOUS

## 2017-01-13 MED ORDER — SODIUM CHLORIDE 0.9 % IV SOLN: INTRAVENOUS | Status: DC

## 2017-01-13 MED ORDER — PROPOFOL 10 MG/ML IV BOLUS
INTRAVENOUS | Status: DC | PRN
  Administered 2017-01-13 (×2): 20 mg via INTRAVENOUS
  Administered 2017-01-13: 50 mg via INTRAVENOUS

## 2017-01-13 MED ORDER — LACTATED RINGERS IV SOLN
INTRAVENOUS | Status: DC
  Administered 2017-01-13: 1000 mL via INTRAVENOUS

## 2017-01-13 MED ORDER — PROPOFOL 10 MG/ML IV BOLUS
INTRAVENOUS | Status: AC
  Filled 2017-01-13: qty 20

## 2017-01-13 MED ORDER — LIDOCAINE 2% (20 MG/ML) 5 ML SYRINGE
INTRAMUSCULAR | Status: AC
  Filled 2017-01-13: qty 5

## 2017-01-13 MED ORDER — LIDOCAINE 2% (20 MG/ML) 5 ML SYRINGE
INTRAMUSCULAR | Status: DC | PRN
  Administered 2017-01-13: 100 mg via INTRAVENOUS

## 2017-01-13 MED ORDER — PROPOFOL 10 MG/ML IV BOLUS
INTRAVENOUS | Status: AC
  Filled 2017-01-13: qty 40

## 2017-01-13 SURGICAL SUPPLY — 21 items

## 2017-01-13 NOTE — Discharge Instructions (Signed)

## 2017-01-13 NOTE — H&P (Signed)
Procedure: Baseline screening colonoscopy  History: The patient is a 51 -year-old male born Apr 21, 1966. He is scheduled to undergo his first screening colonoscopy with polypectomy to prevent colon cancer.  Medication allergies: Januvia caused pancreatitis  Past medical history: Type 2 diabetes mellitus. Hypertension. Hypercholesterolemia. Acute pancreatitis due to Januvia.  Exam: The patient is alert and lying comfortably on the endoscopy stretcher. Abdomen is soft and nontender to palpation. Lungs are clear to auscultation. Cardiac exam reveals a regular rhythm.  Plan: Proceed with screening colonoscopy

## 2017-01-13 NOTE — Anesthesia Postprocedure Evaluation (Addendum)
Anesthesia Post Note  Patient: [name]  Procedure(s) Performed: Procedure(s) (LRB): COLONOSCOPY WITH PROPOFOL (N/A)  Patient location during evaluation: Endoscopy Anesthesia Type: MAC Level of consciousness: awake and alert Pain management: pain level controlled Vital Signs Assessment: post-procedure vital signs reviewed and stable Respiratory status: spontaneous breathing, nonlabored ventilation, respiratory function stable and patient connected to nasal cannula oxygen Cardiovascular status: stable and blood pressure returned to baseline Anesthetic complications: no       Last Vitals:  Vitals:   01/13/17 1353 01/13/17 1420  BP: 112/67 (!) 150/89  Pulse: 70   Resp: 17   Temp: 36.7 C     Last Pain:  Vitals:   01/13/17 1353  TempSrc: Oral                 Salena Ortlieb,JAMES TERRILL

## 2017-01-13 NOTE — Op Note (Signed)
[facility] Patient Name: [name] Procedure Date: 01/13/2017 MRN: [id number] Attending MD: Charolett Bumpers , MD Date of Birth: June 27, 1966 CSN: [id number] Age: 51 Admit Type: Outpatient Procedure:                Colonoscopy Indications:              Screening for colorectal malignant neoplasm Providers:                Charolett Bumpers, MD, Tillie Fantasia, RN, Darletta Moll Tech, Technician, Randon Goldsmith, CRNA Referring MD:              Medicines:                Propofol per Anesthesia Complications:            No immediate complications. Estimated Blood Loss:     Estimated blood loss: none. Procedure:                Pre-Anesthesia Assessment:                           - Prior to the procedure, a History and Physical                            was performed, and patient medications and                            allergies were reviewed. The patient's tolerance of                            previous anesthesia was also reviewed. The risks                            and benefits of the procedure and the sedation                            options and risks were discussed with the patient.                            All questions were answered, and informed consent                            was obtained. Prior Anticoagulants: The patient has                            taken aspirin, last dose was day of procedure. ASA                            Grade Assessment: II - A patient with mild systemic                            disease. After reviewing the risks and benefits,  the patient was deemed in satisfactory condition to                            undergo the procedure.                           After obtaining informed consent, the colonoscope                            was passed under direct vision. Throughout the                            procedure, the patient's blood pressure, pulse, and             oxygen saturations were monitored continuously. The                            [id number] ([id number] scope was introduced through                            the anus and advanced to the the cecum, identified                            by appendiceal orifice and ileocecal valve. The                            colonoscopy was performed without difficulty. The                            patient tolerated the procedure well. The quality                            of the bowel preparation was good. The terminal                            ileum, the ileocecal valve, the appendiceal orifice                            and the rectum were photographed. Scope In: 1:30:20 PM Scope Out: 1:46:36 PM Scope Withdrawal Time: 0 hours 8 minutes 7 seconds  Total Procedure Duration: 0 hours 16 minutes 16 seconds  Findings:      The perianal and digital rectal examinations were normal.      The entire examined colon appeared normal. Impression:               - The entire examined colon is normal.                           - No specimens collected. Moderate Sedation:      N/A- Per Anesthesia Care Recommendation:           - Patient has a contact number available for                            emergencies. The signs and symptoms of potential  delayed complications were discussed with the                            patient. Return to normal activities tomorrow.                            Written discharge instructions were provided to the                            patient.                           - Repeat colonoscopy in 10 years for screening                            purposes.                           - Resume previous diet.                           - Continue present medications. Procedure Code(s):        --- Professional ---                           Z6109, Colorectal cancer screening; colonoscopy on                            individual not meeting criteria for high  risk Diagnosis Code(s):        --- Professional ---                           Z12.11, Encounter for screening for malignant                            neoplasm of colon CPT copyright 2016 [employer]. All rights reserved. The codes documented in this report are preliminary and upon coder review may  be revised to meet current compliance requirements. [name], MD Charolett Bumpers, MD 01/13/2017 1:51:26 PM This report has been signed electronically. Number of Addenda: 0

## 2017-01-13 NOTE — Anesthesia Preprocedure Evaluation (Addendum)
Anesthesia Evaluation  Patient identified by MRN, date of birth, ID band Patient awake    Reviewed: Allergy & Precautions, NPO status , Patient's Chart, lab work & pertinent test results  Airway Mallampati: I       Dental  (+) Teeth Intact   Pulmonary neg pulmonary ROS,    breath sounds clear to auscultation       Cardiovascular hypertension, negative cardio ROS   Rhythm:Regular Rate:Normal     Neuro/Psych negative neurological ROS  negative psych ROS   GI/Hepatic negative GI ROS, Neg liver ROS,   Endo/Other  negative endocrine ROSdiabetes, Well Controlled, Type 1, Insulin Dependent  Renal/GU negative Renal ROS  negative genitourinary   Musculoskeletal negative musculoskeletal ROS (+)   Abdominal   Peds negative pediatric ROS (+)  Hematology negative hematology ROS (+)   Anesthesia Other Findings   Reproductive/Obstetrics negative OB ROS                            Anesthesia Physical Anesthesia Plan  ASA: III  Anesthesia Plan: MAC   Post-op Pain Management:    Induction: Intravenous  Airway Management Planned: Natural Airway and Nasal Cannula  Additional Equipment:   Intra-op Plan:   Post-operative Plan:   Informed Consent: I have reviewed the patients History and Physical, chart, labs and discussed the procedure including the risks, benefits and alternatives for the proposed anesthesia with the patient or authorized representative who has indicated his/her understanding and acceptance.   Dental advisory given  Plan Discussed with:   Anesthesia Plan Comments:        Anesthesia Quick Evaluation

## 2017-01-13 NOTE — Transfer of Care (Signed)
Immediate Anesthesia Transfer of Care Note  Patient: [name]  Procedure(s) Performed: Procedure(s): COLONOSCOPY WITH PROPOFOL (N/A)  Patient Location: PACU  Anesthesia Type:MAC  Level of Consciousness: Patient easily awoken, sedated, comfortable, cooperative, following commands, responds to stimulation.   Airway & Oxygen Therapy: Patient spontaneously breathing, ventilating well, oxygen via simple oxygen mask.  Post-op Assessment: Report given to PACU RN, vital signs reviewed and stable, moving all extremities.   Post vital signs: Reviewed and stable.  Complications: No apparent anesthesia complications Last Vitals:  Vitals:   01/13/17 1220 01/13/17 1353  BP: (!) 132/95 112/67  Pulse:  70  Resp: 10 17  Temp: 36.7 C 36.7 C    Last Pain:  Vitals:   01/13/17 1353  TempSrc: Oral         Complications: No apparent anesthesia complications

## 2017-01-14 ENCOUNTER — Encounter (HOSPITAL_COMMUNITY): Payer: Self-pay | Admitting: Gastroenterology

## 2017-05-16 NOTE — Addendum Note (Signed)
Addendum  created 05/16/17 1013 by Angelle Isais, MD   Sign clinical note    

## 2017-10-10 DIAGNOSIS — Z0000 Encounter for general adult medical examination without abnormal findings: Secondary | ICD-10-CM | POA: Diagnosis not present

## 2017-10-10 DIAGNOSIS — E7800 Pure hypercholesterolemia, unspecified: Secondary | ICD-10-CM | POA: Diagnosis not present

## 2017-10-10 DIAGNOSIS — Z23 Encounter for immunization: Secondary | ICD-10-CM | POA: Diagnosis not present

## 2017-10-10 DIAGNOSIS — I10 Essential (primary) hypertension: Secondary | ICD-10-CM | POA: Diagnosis not present

## 2017-10-10 DIAGNOSIS — E13319 Other specified diabetes mellitus with unspecified diabetic retinopathy without macular edema: Secondary | ICD-10-CM | POA: Diagnosis not present

## 2017-10-10 DIAGNOSIS — Z125 Encounter for screening for malignant neoplasm of prostate: Secondary | ICD-10-CM | POA: Diagnosis not present

## 2017-10-10 DIAGNOSIS — E1165 Type 2 diabetes mellitus with hyperglycemia: Secondary | ICD-10-CM | POA: Diagnosis not present

## 2017-11-18 DIAGNOSIS — E119 Type 2 diabetes mellitus without complications: Secondary | ICD-10-CM | POA: Diagnosis not present

## 2018-03-03 DIAGNOSIS — L723 Sebaceous cyst: Secondary | ICD-10-CM | POA: Diagnosis not present

## 2018-03-12 ENCOUNTER — Other Ambulatory Visit: Payer: Self-pay | Admitting: General Surgery

## 2018-03-12 DIAGNOSIS — D216 Benign neoplasm of connective and other soft tissue of trunk, unspecified: Secondary | ICD-10-CM | POA: Diagnosis not present

## 2018-03-12 DIAGNOSIS — D492 Neoplasm of unspecified behavior of bone, soft tissue, and skin: Secondary | ICD-10-CM | POA: Diagnosis not present

## 2018-04-15 DIAGNOSIS — E13319 Other specified diabetes mellitus with unspecified diabetic retinopathy without macular edema: Secondary | ICD-10-CM | POA: Diagnosis not present

## 2018-04-15 DIAGNOSIS — E7800 Pure hypercholesterolemia, unspecified: Secondary | ICD-10-CM | POA: Diagnosis not present

## 2018-04-15 DIAGNOSIS — I10 Essential (primary) hypertension: Secondary | ICD-10-CM | POA: Diagnosis not present

## 2018-04-15 DIAGNOSIS — E1165 Type 2 diabetes mellitus with hyperglycemia: Secondary | ICD-10-CM | POA: Diagnosis not present

## 2018-05-15 DIAGNOSIS — M62838 Other muscle spasm: Secondary | ICD-10-CM | POA: Diagnosis not present

## 2018-10-30 DIAGNOSIS — Z125 Encounter for screening for malignant neoplasm of prostate: Secondary | ICD-10-CM | POA: Diagnosis not present

## 2018-10-30 DIAGNOSIS — Z23 Encounter for immunization: Secondary | ICD-10-CM | POA: Diagnosis not present

## 2018-10-30 DIAGNOSIS — E7800 Pure hypercholesterolemia, unspecified: Secondary | ICD-10-CM | POA: Diagnosis not present

## 2018-10-30 DIAGNOSIS — E13319 Other specified diabetes mellitus with unspecified diabetic retinopathy without macular edema: Secondary | ICD-10-CM | POA: Diagnosis not present

## 2018-10-30 DIAGNOSIS — I10 Essential (primary) hypertension: Secondary | ICD-10-CM | POA: Diagnosis not present

## 2018-10-30 DIAGNOSIS — Z0000 Encounter for general adult medical examination without abnormal findings: Secondary | ICD-10-CM | POA: Diagnosis not present

## 2018-10-30 DIAGNOSIS — E1165 Type 2 diabetes mellitus with hyperglycemia: Secondary | ICD-10-CM | POA: Diagnosis not present

## 2018-11-30 DIAGNOSIS — E119 Type 2 diabetes mellitus without complications: Secondary | ICD-10-CM | POA: Diagnosis not present

## 2019-01-22 DIAGNOSIS — E1165 Type 2 diabetes mellitus with hyperglycemia: Secondary | ICD-10-CM | POA: Diagnosis not present

## 2019-02-17 ENCOUNTER — Encounter (HOSPITAL_COMMUNITY): Payer: Self-pay | Admitting: Emergency Medicine

## 2019-02-17 ENCOUNTER — Other Ambulatory Visit: Payer: Self-pay

## 2019-02-17 ENCOUNTER — Emergency Department (HOSPITAL_COMMUNITY): Payer: BLUE CROSS/BLUE SHIELD

## 2019-02-17 ENCOUNTER — Emergency Department (HOSPITAL_COMMUNITY)
Admission: EM | Admit: 2019-02-17 | Discharge: 2019-02-18 | Disposition: A | Discharge status: Home / Self Care | Payer: BLUE CROSS/BLUE SHIELD | Attending: Emergency Medicine | Admitting: Emergency Medicine

## 2019-02-17 DIAGNOSIS — R1013 Epigastric pain: Secondary | ICD-10-CM | POA: Insufficient documentation

## 2019-02-17 DIAGNOSIS — Z794 Long term (current) use of insulin: Secondary | ICD-10-CM | POA: Insufficient documentation

## 2019-02-17 DIAGNOSIS — Z79899 Other long term (current) drug therapy: Secondary | ICD-10-CM | POA: Insufficient documentation

## 2019-02-17 DIAGNOSIS — E119 Type 2 diabetes mellitus without complications: Secondary | ICD-10-CM | POA: Diagnosis not present

## 2019-02-17 DIAGNOSIS — I10 Essential (primary) hypertension: Secondary | ICD-10-CM | POA: Diagnosis not present

## 2019-02-17 DIAGNOSIS — Z7982 Long term (current) use of aspirin: Secondary | ICD-10-CM | POA: Insufficient documentation

## 2019-02-17 DIAGNOSIS — R079 Chest pain, unspecified: Secondary | ICD-10-CM | POA: Diagnosis not present

## 2019-02-17 DIAGNOSIS — R1084 Generalized abdominal pain: Secondary | ICD-10-CM | POA: Diagnosis not present

## 2019-02-17 LAB — CBC
HCT: 39.5 % (ref 39.0–52.0)
Hemoglobin: 13 g/dL (ref 13.0–17.0)
MCH: 28.3 pg (ref 26.0–34.0)
MCHC: 32.9 g/dL (ref 30.0–36.0)
MCV: 85.9 fL (ref 80.0–100.0)
Platelets: 191 10*3/uL (ref 150–400)
RBC: 4.6 MIL/uL (ref 4.22–5.81)
RDW: 12.3 % (ref 11.5–15.5)
WBC: 7.3 10*3/uL (ref 4.0–10.5)
nRBC: 0 % (ref 0.0–0.2)

## 2019-02-17 LAB — COMPREHENSIVE METABOLIC PANEL
ALT: 15 U/L (ref 0–44)
ANION GAP: 10 (ref 5–15)
AST: 17 U/L (ref 15–41)
Albumin: 4.3 g/dL (ref 3.5–5.0)
Alkaline Phosphatase: 52 U/L (ref 38–126)
BUN: 12 mg/dL (ref 6–20)
CO2: 25 mmol/L (ref 22–32)
Calcium: 9.3 mg/dL (ref 8.9–10.3)
Chloride: 102 mmol/L (ref 98–111)
Creatinine, Ser: 1.03 mg/dL (ref 0.61–1.24)
GFR calc Af Amer: >60 mL/min (ref >60)
Glucose, Bld: 89 mg/dL (ref 70–99)
Potassium: 4.1 mmol/L (ref 3.5–5.1)
Sodium: 137 mmol/L (ref 135–145)
Total Bilirubin: 0.4 mg/dL (ref 0.3–1.2)
Total Protein: 7.1 g/dL (ref 6.5–8.1)

## 2019-02-17 LAB — I-STAT TROPONIN, ED: TROPONIN I, POC: 0 ng/mL (ref 0.00–0.08)

## 2019-02-17 LAB — LIPASE, BLOOD: Lipase: 36 U/L (ref 11–51)

## 2019-02-17 MED ORDER — SODIUM CHLORIDE 0.9% FLUSH: 3.0000 mL | Freq: Once | INTRAVENOUS | Status: DC

## 2019-02-17 NOTE — ED Triage Notes (Signed)
Pt states that he has been having indigestion, epigsatric pain, diarrhea, and abdominal pain x 2 days. States it feels similar to when he had pancreatitis. Reports being sent by UC for chest pain and pancreatitis r/o

## 2019-02-18 ENCOUNTER — Emergency Department (HOSPITAL_COMMUNITY): Payer: BLUE CROSS/BLUE SHIELD

## 2019-02-18 MED ORDER — ALUM & MAG HYDROXIDE-SIMETH 200-200-20 MG/5ML PO SUSP
30.0000 mL | Freq: Once | ORAL | Status: AC
  Administered 2019-02-18: 30 mL via ORAL
  Filled 2019-02-18: qty 30

## 2019-02-18 MED ORDER — LIDOCAINE VISCOUS HCL 2 % MT SOLN
15.0000 mL | Freq: Once | OROMUCOSAL | Status: AC
  Administered 2019-02-18: 15 mL via ORAL
  Filled 2019-02-18: qty 15

## 2019-02-18 MED ORDER — PANTOPRAZOLE SODIUM 40 MG PO TBEC
40.0000 mg | DELAYED_RELEASE_TABLET | Freq: Once | ORAL | Status: AC
  Administered 2019-02-18: 40 mg via ORAL
  Filled 2019-02-18: qty 1

## 2019-02-18 MED ORDER — PANTOPRAZOLE SODIUM 40 MG IV SOLR: 40.0000 mg | Freq: Once | INTRAVENOUS | Status: DC

## 2019-02-18 NOTE — ED Notes (Signed)
Sent hbere from [facility] pain all over his body

## 2019-02-18 NOTE — Discharge Instructions (Addendum)
Your work-up in the emergency department today has been reassuring.  We recommend follow-up with your primary care doctor for evaluation of symptoms, especially if they remain ongoing.  Continue your daily prescribed medicines.  You may benefit from adding daily Prilosec OTC if you find that your symptoms improved with the Maalox and Protonix given in the ED. Continue to drink plenty of water to remain well hydrated. Return to the ED for new or concerning symptoms.

## 2019-02-18 NOTE — ED Provider Notes (Signed)
[facility] Provider Note   CSN: [id number] Arrival date & time: 02/17/19  1807    History   Chief Complaint Chief Complaint  Patient presents with  . Pancreatitis    HPI [name] is a 53 y.o. male.    53 year old male with a history of diabetes and hypertension presents to the emergency department for evaluation of epigastric pain.  He has been experiencing pain intermittently over the past 3 days.  Reports that symptoms began after he had 2 keto diet protein shakes followed by a salad with chicken later on in the day.  Reports some discomfort in his epigastrium and upper chest reminiscent of prior episode of pancreatitis.  He had some looser stool during the day, but has resumed normal bowel movements for the past 48 hours.  Notes that his pain continues to wax and wane.  It is intermittent.  He has not taken any medications for his symptoms.  Currently describes a soreness in his abdomen, but denies any severe pain.  He has not had any fevers, nausea, vomiting, shortness of breath, urinary symptoms, melena or hematochezia.  Denies any exertional component to his epigastric or chest discomfort.  Specifically, was able to complete a workout at the gym today.  Further denies any diaphoresis, clamminess, syncope or near syncope associated with his pain.  The history is provided by the patient. No language interpreter was used.    Past Medical History:  Diagnosis Date  . Diabetes mellitus without complication (HCC)   . Hypertension     Patient Active Problem List   Diagnosis Date Noted  . Pancreatitis, acute 11/26/2015  . Diabetes mellitus due to underlying condition with hyperglycemia (HCC) 11/26/2015  . Acute pancreatitis 11/26/2015    Past Surgical History:  Procedure Laterality Date  . COLONOSCOPY WITH PROPOFOL N/A 01/13/2017   Procedure: COLONOSCOPY WITH PROPOFOL;  Surgeon: Charolett BumpersMartin K Johnson, MD;  Location: [facility];  Service:  Endoscopy;  Laterality: N/A;        Home Medications    Prior to Admission medications   Medication Sig Start Date End Date Taking? Authorizing Provider  aspirin EC 81 MG tablet Take 81 mg by mouth daily.    [provider]  glipiZIDE (GLUCOTROL) 10 MG tablet Take 20 mg by mouth daily before supper.    [provider]  LEVEMIR FLEXTOUCH 100 UNIT/ML Pen Inject 24 Units into the skin at bedtime. 12/10/16   [provider]  lisinopril (PRINIVIL,ZESTRIL) 10 MG tablet Take 10 mg by mouth daily.    [provider]  metFORMIN (GLUCOPHAGE) 1000 MG tablet Take 1,000 mg by mouth 2 (two) times daily with a meal.    [provider]  Multiple Vitamin (MULTIVITAMIN WITH MINERALS) TABS tablet Take 1 tablet by mouth daily at 12 noon.    [provider]  simvastatin (ZOCOR) 10 MG tablet Take 10 mg by mouth daily at 6 PM.    [provider]    Family History No family history on file.  Social History Social History   Tobacco Use  . Smoking status: Never Smoker  . Smokeless tobacco: Never Used  Substance Use Topics  . Alcohol use: No  . Drug use: No     Allergies   Shellfish allergy and Strawberry (diagnostic)   Review of Systems Review of Systems Ten systems reviewed and are negative for acute change, except as noted in the HPI.    Physical Exam Updated Vital Signs BP  121/82   Pulse 77   Temp 98.2 F (36.8 C) (Oral)   Resp 18   SpO2 99%   Physical Exam Vitals signs and nursing note reviewed.  Constitutional:      General: He is not in acute distress.    Appearance: He is well-developed. He is not diaphoretic.     Comments: Nontoxic appearing and in no acute distress.  HENT:     Head: Normocephalic and atraumatic.  Eyes:     General: No scleral icterus.    Conjunctiva/sclera: Conjunctivae normal.  Neck:     Musculoskeletal: Normal range of motion.  Cardiovascular:     Rate and Rhythm: Normal rate and regular  rhythm.     Pulses: Normal pulses.  Pulmonary:     Effort: Pulmonary effort is normal. No respiratory distress.     Breath sounds: No stridor. No wheezing.     Comments: Respirations even and unlabored Abdominal:     Palpations: Abdomen is soft. There is no mass.     Tenderness: There is no guarding.     Comments: General mid and epigastric abdominal discomfort.  No guarding on palpation.  No peritoneal signs, distention, palpable masses.  Musculoskeletal: Normal range of motion.  Skin:    General: Skin is warm and dry.     Coloration: Skin is not pale.     Findings: No erythema or rash.  Neurological:     Mental Status: He is alert and oriented to person, place, and time.  Psychiatric:        Behavior: Behavior normal.      ED Treatments / Results  Labs (all labs ordered are listed, but only abnormal results are displayed) Labs Reviewed  LIPASE, BLOOD  COMPREHENSIVE METABOLIC PANEL  CBC  I-STAT TROPONIN, ED    EKG EKG Interpretation  Date/Time:  Wednesday February 17 2019 18:51:03 EST Ventricular Rate:  90 PR Interval:  142 QRS Duration: 82 QT Interval:  326 QTC Calculation: 398 R Axis:   80 Text Interpretation:  Normal sinus rhythm Nonspecific T wave abnormality Abnormal ECG No old tracing to compare Confirmed by Dione Booze ([id number]) on 02/17/2019 10:56:15 PM   Radiology Dg Chest 2 View  Result Date: 02/17/2019 CLINICAL DATA:  Central chest pain for 1 day. EXAM: CHEST - 2 VIEW COMPARISON:  None. FINDINGS: The cardiomediastinal contours are normal. The lungs are clear. Pulmonary vasculature is normal. No consolidation, pleural effusion, or pneumothorax. No acute osseous abnormalities are seen. IMPRESSION: Negative radiographs of the chest. Electronically Signed   By: Narda Rutherford M.D.   On: 02/17/2019 20:14    Procedures Procedures (including critical care time)  Medications Ordered in ED Medications  sodium chloride flush (NS) 0.9 % injection 3 mL (has no  administration in time range)  pantoprazole (PROTONIX) EC tablet 40 mg (has no administration in time range)  alum & mag hydroxide-simeth (MAALOX/MYLANTA) 200-200-20 MG/5ML suspension 30 mL (30 mLs Oral Given 02/18/19 0120)    And  lidocaine (XYLOCAINE) 2 % viscous mouth solution 15 mL (15 mLs Oral Given 02/18/19 0120)     Initial Impression / Assessment and Plan / ED Course  I have reviewed the triage vital signs and the nursing notes.  Pertinent labs & imaging results that were available during my care of the patient were reviewed by me and considered in my medical decision making (see chart for details).        54 year old male presents to the emergency department at the advice of  provider from urgent care.  He has had 3 days of waxing and waning, intermittent epigastric and chest discomfort.  Onset of symptoms was after drinking to keto diet protein shakes.  Initially had some diarrhea, but this has resolved.  Denies taking any medications for his abdominal discomfort.  Urgent care provider expressed concern for possible ACS.  His symptoms are atypical for this and are nonexertional.  He has a negative troponin.  EKG shows nonspecific T wave changes without other evidence of acute myocardial injury.  Chest x-ray without evidence of pneumothorax, pneumonia, pleural effusion.  Cardiac silhouette normal.  Patient feels that his symptoms are similar to his past episode of pancreatitis.  His lipase today is normal.  Laboratory work-up generally reassuring without leukocytosis, electrolyte derangements.  Liver and kidney function preserved.  His abdominal exam is benign.  Question whether he may be experiencing a degree of dyspepsia/gastritis precipitated from dietary changes.  He was given Maalox and Protonix prior to discharge.  I did offer the patient the option for ultrasound in the emergency department, though I feel this can also be completed by his primary care doctor if symptoms persist.  The  patient opts for continued outpatient follow-up at this time.  Encouraged continued fluid hydration with OTC PPI PRN.  Return precautions discussed and provided. Patient discharged in stable condition with no unaddressed concerns.   Final Clinical Impressions(s) / ED Diagnoses   Final diagnoses:  Epigastric pain    ED Discharge Orders    None       Antony Madura, PA-C 02/18/19 0133    Glynn Octave, MD 02/18/19 [phone]

## 2019-02-26 DIAGNOSIS — E11319 Type 2 diabetes mellitus with unspecified diabetic retinopathy without macular edema: Secondary | ICD-10-CM | POA: Diagnosis not present

## 2019-02-26 DIAGNOSIS — R1013 Epigastric pain: Secondary | ICD-10-CM | POA: Diagnosis not present

## 2019-04-01 DIAGNOSIS — Z713 Dietary counseling and surveillance: Secondary | ICD-10-CM | POA: Diagnosis not present

## 2019-04-13 DIAGNOSIS — Z713 Dietary counseling and surveillance: Secondary | ICD-10-CM | POA: Diagnosis not present

## 2019-05-04 DIAGNOSIS — I10 Essential (primary) hypertension: Secondary | ICD-10-CM | POA: Diagnosis not present

## 2019-05-04 DIAGNOSIS — E7800 Pure hypercholesterolemia, unspecified: Secondary | ICD-10-CM | POA: Diagnosis not present

## 2019-05-04 DIAGNOSIS — R21 Rash and other nonspecific skin eruption: Secondary | ICD-10-CM | POA: Diagnosis not present

## 2019-05-04 DIAGNOSIS — E11319 Type 2 diabetes mellitus with unspecified diabetic retinopathy without macular edema: Secondary | ICD-10-CM | POA: Diagnosis not present

## 2019-05-05 DIAGNOSIS — E7800 Pure hypercholesterolemia, unspecified: Secondary | ICD-10-CM | POA: Diagnosis not present

## 2019-05-05 DIAGNOSIS — E11319 Type 2 diabetes mellitus with unspecified diabetic retinopathy without macular edema: Secondary | ICD-10-CM | POA: Diagnosis not present

## 2019-05-05 DIAGNOSIS — I10 Essential (primary) hypertension: Secondary | ICD-10-CM | POA: Diagnosis not present

## 2019-05-11 DIAGNOSIS — Z713 Dietary counseling and surveillance: Secondary | ICD-10-CM | POA: Diagnosis not present

## 2019-08-03 DIAGNOSIS — Z713 Dietary counseling and surveillance: Secondary | ICD-10-CM | POA: Diagnosis not present

## 2019-11-04 DIAGNOSIS — E11319 Type 2 diabetes mellitus with unspecified diabetic retinopathy without macular edema: Secondary | ICD-10-CM | POA: Diagnosis not present

## 2019-11-04 DIAGNOSIS — I10 Essential (primary) hypertension: Secondary | ICD-10-CM | POA: Diagnosis not present

## 2019-11-04 DIAGNOSIS — E7800 Pure hypercholesterolemia, unspecified: Secondary | ICD-10-CM | POA: Diagnosis not present

## 2019-11-04 DIAGNOSIS — Z23 Encounter for immunization: Secondary | ICD-10-CM | POA: Diagnosis not present

## 2020-01-04 ENCOUNTER — Other Ambulatory Visit: Payer: Self-pay

## 2020-01-05 NOTE — Progress Notes (Signed)
Name: [name]  MRN/ DOB: [id number], May 28, 1966   Age/ Sex: 54 y.o., male    PCP: Seward Carol, MD   Reason for Endocrinology Evaluation: Type 2 Diabetes Mellitus     Date of Initial Endocrinology Visit: 01/06/2020     PATIENT IDENTIFIER: [name] is a 54 y.o. male with a past medical history of T2DM, Dyslipidemia and Hx of Pancreatitis ( ? Januvia) in 2016 and HTN.  The patient presented for initial endocrinology clinic visit on 01/06/2020 for consultative assistance with his diabetes management.    HPI: [name] was    Diagnosed with DM in 1996 Prior Medications tried/Intolerance: was initially on metformin until ~ 2010 when he started taking consistently , Glipizide added later. Januvia (Pancreatitis)  Currently checking blood sugars 1-2 x / day,  before and after meals  Hypoglycemia episodes : no             Hemoglobin A1c has ranged from 7.3% in 04/2019, peaking at 8.5 % in 10/2019 Patient required assistance for hypoglycemia: no Patient has required hospitalization within the last 1 year from hyper or hypoglycemia: no   In terms of diet, the patient eats 3 meals a day, snacks on almond. Avoids sugar sweetened beverages.   Lifts weights and rides the bicycle  No specific complaints today other then concerns about glycemic control    HOME DIABETES REGIMEN: Glipizide 10 mg daily with supper  Metformin 1000 mg BID  Levemir 40 units daily    Statin: Yes- Simvastatin ACE-I/ARB: Yes- Lisinopril  Prior Diabetic Education:yes    GLUCOSE LOG:   DIABETIC COMPLICATIONS: Microvascular complications:   Retinopathy (minimal)   Denies: CKD , neuropathy  Last eye exam: Completed 2019  Macrovascular complications:    Denies: CAD, PVD, CVA   PAST HISTORY: Past Medical History:  Past Medical History:  Diagnosis Date  . Diabetes mellitus without complication ([place])   . Hypertension    Past Surgical History:  Past Surgical History:  Procedure  Laterality Date  . COLONOSCOPY WITH PROPOFOL N/A 01/13/2017   Procedure: COLONOSCOPY WITH PROPOFOL;  Surgeon: Garlan Fair, MD;  Location: [facility];  Service: Endoscopy;  Laterality: N/A;      Social History:  reports that he has never smoked. He has never used smokeless tobacco. He reports that he does not drink alcohol or use drugs. Family History:  Family History  Problem Relation Age of Onset  . Diabetes Mother   . Diabetes Father      HOME MEDICATIONS: Allergies as of 01/06/2020      Reactions   Shellfish Allergy Itching, Other (See Comments)   Scratchy throat   Strawberry (diagnostic) Itching, Other (See Comments)   Scratchy throat      Medication List       Accurate as of January 06, 2020 10:13 AM. If you have any questions, ask your nurse or doctor.        STOP taking these medications   glipiZIDE 10 MG tablet Commonly known as: GLUCOTROL Stopped by: Dorita Sciara, MD   Levemir FlexTouch 100 UNIT/ML Pen Generic drug: Insulin Detemir Stopped by: Dorita Sciara, MD     TAKE these medications   aspirin EC 81 MG tablet Take 81 mg by mouth daily.   Farxiga 5 MG Tabs tablet Generic drug: dapagliflozin propanediol Take 5 mg by mouth daily before breakfast. Started by: Dorita Sciara, MD   lisinopril 10 MG tablet Commonly known as: ZESTRIL Take 10 mg  by mouth daily.   metFORMIN 1000 MG tablet Commonly known as: GLUCOPHAGE Take 1,000 mg by mouth 2 (two) times daily with a meal.   multivitamin with minerals Tabs tablet Take 1 tablet by mouth daily at 12 noon.   PRILOSEC OTC PO Take by mouth.   simvastatin 10 MG tablet Commonly known as: ZOCOR Take 10 mg by mouth daily at 6 PM.   Evaristo Bury FlexTouch 100 UNIT/ML Sopn FlexTouch Pen Generic drug: insulin degludec Inject 0.34 mLs (34 Units total) into the skin daily. Started by: Scarlette Shorts, MD        ALLERGIES: Allergies  Allergen Reactions  . Shellfish Allergy  Itching and Other (See Comments)    Scratchy throat  . Strawberry (Diagnostic) Itching and Other (See Comments)    Scratchy throat     REVIEW OF SYSTEMS: A comprehensive ROS was conducted with the patient and is negative except as per HPI      OBJECTIVE:   VITAL SIGNS: BP (!) 144/84 (BP Location: Right Arm, Patient Position: Sitting, Cuff Size: Large)   Pulse 89   Temp 98.3 F (36.8 C)   Ht 5\' 10"  (1.778 m)   Wt 208 lb 9.6 oz (94.6 kg)   SpO2 98%   BMI 29.93 kg/m    PHYSICAL EXAM:  General: Pt appears well and is in NAD  HEENT: Head: Unremarkable .  Eyes: External eye exam normal without stare, lid lag or exophthalmos.  EOM intact.    Neck: General: Supple without adenopathy or carotid bruits. Thyroid: Thyroid size normal.  No goiter or nodules appreciated. No thyroid bruit.  Lungs: Clear with good BS bilat with no rales, rhonchi, or wheezes  Heart: RRR with normal S1 and S2 and no gallops; no murmurs; no rub  Abdomen: Normoactive bowel sounds, soft, nontender, without masses or organomegaly palpable  Extremities: Lower extremities - No pretibial edema. No lesions.  Skin: Normal texture and temperature to palpation.   Neuro: MS is good with appropriate affect, pt is alert and Ox3    DM foot exam: deferred   DATA REVIEWED:  Lab Results  Component Value Date   HGBA1C 9.6 (H) 11/26/2015   Lab Results  Component Value Date   LDLCALC 82 11/27/2015   CREATININE 1.03 02/17/2019   11/04/2019 TG 151 HDL 38 LDL 95 A1c 8.5% BUN/Cr 14/1.03  GFR 75 TSH 1.81 uIU/ml  MA/Cr ratio < 22.6        ASSESSMENT / PLAN / RECOMMENDATIONS:   1) Type 2 Diabetes Mellitus, Poorly controlled, With retinopathy complications - Most recent A1c of 8.5%. Goal A1c < 7.0%.    Plan: GENERAL: I have discussed with the patient the pathophysiology of diabetes. We went over the natural progression of the disease. We talked about both insulin resistance and insulin deficiency. We stressed  the importance of lifestyle changes including diet and exercise. I explained the complications associated with diabetes including retinopathy, nephropathy, neuropathy as well as increased risk of cardiovascular disease. We went over the benefit seen with glycemic control.  The patient has improved his glycemic control over the past 2 months, he was encouraged to continue with lifestyle changes.  Due to his history of pancreatitis secondary to Januvia, DPP- 4 inhibitors and GLP-1 agonists are contraindicated   We discussed adding SGL T2 inhibitors, after discussing the risk and the benefits, patient agreed to give it a try.  He was provided with a coupon today.  He was advised of the risk of dental  infection and dehydration, if he gets a dental infection he can reach out to his PCP for appropriate treatment there is no need to discontinue [place] with the first genital infection, but if he gets to infections back to back, patient was advised to stop the [place].  We will recheck BMP on next visit  MEDICATIONS: STOP GLIPIZIDE - STOP LEVEMIR - Start [place] 34 units daily  - Start Farxiga 5 mg daily with Breakfast  - Continue Metformin 1000 mg Twice daily with Breakfast and supper    EDUCATION / INSTRUCTIONS:  BG monitoring instructions: Patient is instructed to check his blood sugars 2 times a day, fasting and bedtime when possible.  Call [facility] if: BG persistently < 70 or > 300. . I reviewed the Rule of 15 for the treatment of hypoglycemia in detail with the patient. Literature supplied.   2) Diabetic complications:   Eye: Does have have known diabetic retinopathy.  He was strongly urged to have another eye exam, as the last one was in 2019  Neuro/ Feet: Does not have known diabetic peripheral neuropathy.  Renal: Patient does not have known baseline CKD. He is on an ACEI/ARB at present.  3) Lipids: Patient is on simvastatin 10 mg daily, no side effects reported.   LDL acceptable at 95 mg/DL    Follow-up in 2 months   Signed electronically by: Lyndle Herrlich, MD  [facility] End[facility]  [facility] [address] [place] [place], [place] [address] Phone: [phone] FAX: [phone]   CC: Renford Dills, MD [address] [place] [address] Phone: [phone]  Fax: [phone]    Return to Endocrinology clinic as below: Future Appointments  Date Time Provider Department Center  03/06/2020  8:10 AM Jaycelyn Orrison, Konrad Dolores, MD [id number] None

## 2020-01-06 ENCOUNTER — Other Ambulatory Visit: Payer: Self-pay

## 2020-01-06 ENCOUNTER — Ambulatory Visit (INDEPENDENT_AMBULATORY_CARE_PROVIDER_SITE_OTHER): Payer: BC Managed Care – PPO | Admitting: Internal Medicine

## 2020-01-06 ENCOUNTER — Encounter: Payer: Self-pay | Admitting: Internal Medicine

## 2020-01-06 VITALS — BP 144/84 | HR 89 | Temp 98.3°F | Ht 70.0 in | Wt 208.6 lb

## 2020-01-06 DIAGNOSIS — E1165 Type 2 diabetes mellitus with hyperglycemia: Secondary | ICD-10-CM | POA: Insufficient documentation

## 2020-01-06 DIAGNOSIS — Z794 Long term (current) use of insulin: Secondary | ICD-10-CM

## 2020-01-06 DIAGNOSIS — E113299 Type 2 diabetes mellitus with mild nonproliferative diabetic retinopathy without macular edema, unspecified eye: Secondary | ICD-10-CM | POA: Diagnosis not present

## 2020-01-06 MED ORDER — TRESIBA FLEXTOUCH 100 UNIT/ML ~~LOC~~ SOPN: 34.0000 [IU] | PEN_INJECTOR | Freq: Every day | SUBCUTANEOUS | 6 refills | Status: DC

## 2020-01-06 MED ORDER — FARXIGA 5 MG PO TABS: 5.0000 mg | ORAL_TABLET | Freq: Every day | ORAL | 4 refills | Status: DC

## 2020-01-06 NOTE — Patient Instructions (Addendum)
-   STOP GLIPIZIDE - STOP LEVEMIR - Start [place] 34 units daily  - Start Farxiga 5 mg daily with Breakfast  - Continue Metformin 1000 mg Twice daily with Breakfast and supper   - If you feel you have a genital infection, please contact Dr. Nehemiah Settle for treatment, no need to stop the medicine with the first infection. But if you get 2 infections in a row,  please stop the [place]    - Important to stay hydrated with Marcelline Deist   - Check sugar fasting and bedtime when you can    HOW TO TREAT LOW BLOOD SUGARS (Blood sugar LESS THAN 70 MG/DL)  Please follow the RULE OF 15 for the treatment of hypoglycemia treatment (when your (blood sugars are less than 70 mg/dL)    STEP 1: Take 15 grams of carbohydrates when your blood sugar is low, which includes:   3-4 GLUCOSE TABS  OR  3-4 OZ OF JUICE OR REGULAR SODA OR  ONE TUBE OF GLUCOSE GEL     STEP 2: RECHECK blood sugar in 15 MINUTES STEP 3: If your blood sugar is still low at the 15 minute recheck --> then, go back to STEP 1 and treat AGAIN with another 15 grams of carbohydrates.

## 2020-02-15 ENCOUNTER — Other Ambulatory Visit: Payer: Self-pay

## 2020-02-15 ENCOUNTER — Telehealth: Payer: Self-pay | Admitting: Internal Medicine

## 2020-02-15 MED ORDER — TRESIBA FLEXTOUCH 100 UNIT/ML ~~LOC~~ SOPN: 34.0000 [IU] | PEN_INJECTOR | Freq: Every day | SUBCUTANEOUS | 6 refills | Status: DC

## 2020-02-15 MED ORDER — INSULIN PEN NEEDLE 29G X 12.7MM MISC: 11 refills | Status: DC

## 2020-02-15 NOTE — Telephone Encounter (Signed)
MEDICATION: insulin degludec (TRESIBA FLEXTOUCH) 100 UNIT/ML SOPN FlexTouch Pen AND BD Ultra Fine 29 gauge pen needles  PHARMACY:   [id number] - [place], [place] - [address] Phone:  [phone]  Fax:  [phone]      IS THIS A 90 DAY SUPPLY : Yes  IS PATIENT OUT OF MEDICATION: Almost  IF NOT; HOW MUCH IS LEFT: Approx. 1 day  LAST APPOINTMENT DATE: @1 /21/2021  NEXT APPOINTMENT DATE:@3 /22/2021  DO WE HAVE YOUR PERMISSION TO LEAVE A DETAILED MESSAGE: Yes  OTHER COMMENTS:    **Let patient know to contact pharmacy at the end of the day to make sure medication is ready. **  ** Please notify patient to allow 48-72 hours to process**  **Encourage patient to contact the pharmacy for refills or they can request refills through [facility]**

## 2020-02-15 NOTE — Telephone Encounter (Signed)
sent 

## 2020-03-02 ENCOUNTER — Other Ambulatory Visit: Payer: Self-pay

## 2020-03-06 ENCOUNTER — Other Ambulatory Visit: Payer: Self-pay

## 2020-03-06 ENCOUNTER — Encounter: Payer: Self-pay | Admitting: Internal Medicine

## 2020-03-06 ENCOUNTER — Ambulatory Visit (INDEPENDENT_AMBULATORY_CARE_PROVIDER_SITE_OTHER): Payer: BC Managed Care – PPO | Admitting: Internal Medicine

## 2020-03-06 VITALS — BP 128/82 | HR 84 | Temp 98.2°F | Ht 70.0 in | Wt 203.4 lb

## 2020-03-06 DIAGNOSIS — E1165 Type 2 diabetes mellitus with hyperglycemia: Secondary | ICD-10-CM | POA: Diagnosis not present

## 2020-03-06 DIAGNOSIS — Z794 Long term (current) use of insulin: Secondary | ICD-10-CM | POA: Insufficient documentation

## 2020-03-06 DIAGNOSIS — E119 Type 2 diabetes mellitus without complications: Secondary | ICD-10-CM | POA: Diagnosis not present

## 2020-03-06 LAB — POCT GLYCOSYLATED HEMOGLOBIN (HGB A1C): Hemoglobin A1C: 6.8 % — AB (ref 4.0–5.6)

## 2020-03-06 LAB — BASIC METABOLIC PANEL
BUN: 21 mg/dL (ref 6–23)
CO2: 28 mEq/L (ref 19–32)
Calcium: 9.7 mg/dL (ref 8.4–10.5)
Chloride: 99 mEq/L (ref 96–112)
Creatinine, Ser: 1.29 mg/dL (ref 0.40–1.50)
GFR: 70.16 mL/min (ref >60.00)
Glucose, Bld: 183 mg/dL — ABNORMAL HIGH (ref 70–99)
Potassium: 4.5 mEq/L (ref 3.5–5.1)
Sodium: 136 mEq/L (ref 135–145)

## 2020-03-06 MED ORDER — TRESIBA FLEXTOUCH 100 UNIT/ML ~~LOC~~ SOPN: 28.0000 [IU] | PEN_INJECTOR | Freq: Every day | SUBCUTANEOUS | 3 refills | Status: DC

## 2020-03-06 MED ORDER — METFORMIN HCL 1000 MG PO TABS: 1000.0000 mg | ORAL_TABLET | Freq: Two times a day (BID) | ORAL | 3 refills | Status: DC

## 2020-03-06 MED ORDER — FARXIGA 10 MG PO TABS: 10.0000 mg | ORAL_TABLET | Freq: Every day | ORAL | 3 refills | Status: DC

## 2020-03-06 NOTE — Progress Notes (Signed)
Name: [name]  Age/ Sex: 54 y.o., male   MRN/ DOB: [id number], [birth year]     PCP: Renford Dills, MD   Reason for Endocrinology Evaluation: Type 2 Diabetes Mellitus  Initial Endocrine Consultative Visit: 01/06/2020    PATIENT IDENTIFIER: [name] is a 54 y.o. male with a past medical history of T2DM, Dyslipidemia and Hx of Pancreatitis ( ? Januvia) in 2016 and HTN. The patient has followed with Endocrinology clinic since 01/06/2020 for consultative assistance with management of his diabetes.  DIABETIC HISTORY:   Mr. Scripter was diagnosed with DM in 1996.  Was initially on metformin until ~ 2010 when he started taking consistently , Glipizide added later. Januvia (Pancreatitis). His hemoglobin A1c has ranged from 7.3% in 04/2019, peaking at 8.5 % in 10/2019   SUBJECTIVE:   During the last visit (01/06/2020): A1c 8.5%, we stopped glipizide and Levemir, we started Tresiba, and Farxiga, we continued metformin.  Today (03/06/2020): Mr. Venn  He checks his blood sugars 2 times daily, preprandial. The patient has not had hypoglycemic episodes since the last clinic visit. Otherwise, the patient has not required any recent emergency interventions for hypoglycemia and has not  had recent hospitalizations secondary to hyper or hypoglycemic episodes.    ROS: As per HPI and as detailed below: Review of Systems  Genitourinary: Positive for frequency.  Endo/Heme/Allergies: Negative for polydipsia.      HOME DIABETES REGIMEN:  Tresiba 34 units daily Farxiga 5 mg daily Metformin 1000 mg twice daily   METER DOWNLOAD SUMMARY: Date  03/06/2020 Thirty day Mean FS Glucose = 134   BG Ranges: Low = 68 High = 261   Hypoglycemic Events/30 Days: BG < 50 = 0 Episodes of symptomatic severe hypoglycemia = 0    DIABETIC COMPLICATIONS: Microvascular complications:   Retinopathy (minimal) - resolved per pt 02/2020  Denies: CKD , neuropathy  Last eye exam: Completed  02/23/2020  Macrovascular complications:    Denies: CAD, PVD, CVA   HISTORY:  Past Medical History:  Past Medical History:  Diagnosis Date  . Diabetes mellitus without complication (HCC)   . Hypertension    Past Surgical History:  Past Surgical History:  Procedure Laterality Date  . COLONOSCOPY WITH PROPOFOL N/A 01/13/2017   Procedure: COLONOSCOPY WITH PROPOFOL;  Surgeon: Charolett Bumpers, MD;  Location: [facility];  Service: Endoscopy;  Laterality: N/A;    Social History:  reports that he has never smoked. He has never used smokeless tobacco. He reports that he does not drink alcohol or use drugs. Family History:  Family History  Problem Relation Age of Onset  . Diabetes Mother   . Diabetes Father      HOME MEDICATIONS: Allergies as of 03/06/2020      Reactions   Shellfish Allergy Itching, Other (See Comments)   Scratchy throat   Strawberry (diagnostic) Itching, Other (See Comments)   Scratchy throat      Medication List       Accurate as of March 06, 2020  8:47 AM. If you have any questions, ask your nurse or doctor.        aspirin EC 81 MG tablet Take 81 mg by mouth daily.   Farxiga 10 MG Tabs tablet Generic drug: dapagliflozin propanediol Take 10 mg by mouth daily before breakfast. What changed:   medication strength  how much to take Changed by: Scarlette Shorts, MD   Insulin Pen Needle 29G X 12.7MM Misc Use as directed E11.65   lisinopril 10  MG tablet Commonly known as: ZESTRIL Take 10 mg by mouth daily.   metFORMIN 1000 MG tablet Commonly known as: GLUCOPHAGE Take 1 tablet (1,000 mg total) by mouth 2 (two) times daily with a meal.   multivitamin with minerals Tabs tablet Take 1 tablet by mouth daily at 12 noon.   PRILOSEC OTC PO Take by mouth.   simvastatin 10 MG tablet Commonly known as: ZOCOR Take 10 mg by mouth daily at 6 PM.   Evaristo Bury FlexTouch 100 UNIT/ML FlexTouch Pen Generic drug: insulin degludec Inject 0.28 mLs  (28 Units total) into the skin daily. What changed: how much to take Changed by: Scarlette Shorts, MD   UNABLE TO FIND Med Name: livago meter        OBJECTIVE:   Vital Signs: BP 128/82 (BP Location: Right Arm, Patient Position: Sitting, Cuff Size: Normal)   Pulse 84   Temp 98.2 F (36.8 C)   Ht 5\' 10"  (1.778 m)   Wt 203 lb 6.4 oz (92.3 kg)   SpO2 98%   BMI 29.18 kg/m   Wt Readings from Last 3 Encounters:  03/06/20 203 lb 6.4 oz (92.3 kg)  01/06/20 208 lb 9.6 oz (94.6 kg)  01/13/17 205 lb (93 kg)     Exam: General: Pt appears well and is in NAD  Lungs: Clear with good BS bilat with no rales, rhonchi, or wheezes  Heart: RRR with normal S1 and S2 and no gallops; no murmurs; no rub  Abdomen: Normoactive bowel sounds, soft, nontender, without masses or organomegaly palpable  Extremities: No pretibial edema.   Skin: Normal texture and temperature to palpation.   Neuro: MS is good with appropriate affect, pt is alert and Ox3    DM foot exam:  03/06/2020  The skin of the feet is intact without sores or ulcerations. The pedal pulses are 2+ on right and 2+ on left. The sensation is intact to a screening 5.07, 10 gram monofilament bilaterally        DATA REVIEWED:  Lab Results  Component Value Date   HGBA1C 6.8 (A) 03/06/2020   HGBA1C 9.6 (H) 11/26/2015   Lab Results  Component Value Date   LDLCALC 82 11/27/2015   CREATININE 1.03 02/17/2019    Lab Results  Component Value Date   CHOL 136 11/27/2015   HDL 34 (L) 11/27/2015   LDLCALC 82 11/27/2015   TRIG 101 11/27/2015   CHOLHDL 4.0 11/27/2015       Results for Mike, Gomez (MRN Luan Pulling) as of 03/07/2020 08:18  Ref. Range 03/06/2020 08:41  Sodium Latest Ref Range: 135 - 145 mEq/L 136  Potassium Latest Ref Range: 3.5 - 5.1 mEq/L 4.5  Chloride Latest Ref Range: 96 - 112 mEq/L 99  CO2 Latest Ref Range: 19 - 32 mEq/L 28  Glucose Latest Ref Range: 70 - 99 mg/dL 03/08/2020 (H)  BUN Latest Ref Range: 6 - 23 mg/dL  21  Creatinine Latest Ref Range: 0.40 - 1.50 mg/dL 601  Calcium Latest Ref Range: 8.4 - 10.5 mg/dL 9.7  GFR Latest Ref Range: >60.00 mL/min 70.16    ASSESSMENT / PLAN / RECOMMENDATIONS:   1) Type 2 Diabetes Mellitus, Optimally controlled, With Hx of retinopathic  complications ( resolved by 02/2020) - Most recent A1c of 6.8 %. Goal A1c < 7.0 %.   - Improved glycemic control , A1c down from 8.5%  - He is tolerating all his meds without side effects, will increase Farxiga and reduce insulin as below  -  Due to his history of pancreatitis secondary to Januvia, DPP- 4 inhibitors and GLP-1 agonists are contraindicated  - We did discuss low CHO diet, this AM his fasting was 225 mg/dL, but after dinner he had sleeping issues, ate pop corn and drank some wine, we used this to discuss about carbs,  - BMP today is normal  MEDICATIONS: - DecreaseTresiba 28 units daily  - Increase Farxiga 10 mg daily with Breakfast  - Continue Metformin 1000 mg Twice daily with Breakfast and supper   EDUCATION / INSTRUCTIONS:  BG monitoring instructions: Patient is instructed to check his blood sugars 2 times a day  Call [facility] if: BG persistently < 70 or > 300. . I reviewed the Rule of 15 for the treatment of hypoglycemia in detail with the patient. Literature supplied.     2) Diabetic complications:   Eye: Did have mild DR per pt but this has resolved by 02/2020 exam.  Neuro/ Feet: Does not have known diabetic peripheral neuropathy .   Renal: Patient does not have known baseline CKD. He   is on an ACEI/ARB at present.   F/U in 4 months    Signed electronically by: Mack Guise, MD  [facility]  [facility] [address] [place], [place] [address] Phone: [phone] FAX: [phone]   CC: [address], [place] [employer] [address] [address] Phone: [phone]  Fax: [phone]  Return to Endocrinology  clinic as below: Future Appointments  Date Time Provider [address]  07/06/2020  8:10 AM Conor Filsaime, Melanie Crazier, MD [id number] None

## 2020-03-06 NOTE — Patient Instructions (Signed)
-   DecreaseTresiba 28 units daily  - Increase Farxiga 10 mg daily with Breakfast  - Continue Metformin 1000 mg Twice daily with Breakfast and supper     HOW TO TREAT LOW BLOOD SUGARS (Blood sugar LESS THAN 70 MG/DL)  Please follow the RULE OF 15 for the treatment of hypoglycemia treatment (when your (blood sugars are less than 70 mg/dL)    STEP 1: Take 15 grams of carbohydrates when your blood sugar is low, which includes:   3-4 GLUCOSE TABS  OR  3-4 OZ OF JUICE OR REGULAR SODA OR  ONE TUBE OF GLUCOSE GEL     STEP 2: RECHECK blood sugar in 15 MINUTES STEP 3: If your blood sugar is still low at the 15 minute recheck --> then, go back to STEP 1 and treat AGAIN with another 15 grams of carbohydrates.

## 2020-03-07 ENCOUNTER — Encounter: Payer: Self-pay | Admitting: Internal Medicine

## 2020-06-20 DIAGNOSIS — J019 Acute sinusitis, unspecified: Secondary | ICD-10-CM | POA: Diagnosis not present

## 2020-07-06 ENCOUNTER — Ambulatory Visit: Payer: BC Managed Care – PPO | Admitting: Internal Medicine

## 2020-08-04 ENCOUNTER — Ambulatory Visit (INDEPENDENT_AMBULATORY_CARE_PROVIDER_SITE_OTHER): Payer: BC Managed Care – PPO | Admitting: Internal Medicine

## 2020-08-04 ENCOUNTER — Encounter: Payer: Self-pay | Admitting: Internal Medicine

## 2020-08-04 ENCOUNTER — Other Ambulatory Visit: Payer: Self-pay

## 2020-08-04 VITALS — BP 116/80 | HR 84 | Resp 18 | Ht 70.0 in | Wt 198.4 lb

## 2020-08-04 DIAGNOSIS — E1165 Type 2 diabetes mellitus with hyperglycemia: Secondary | ICD-10-CM | POA: Diagnosis not present

## 2020-08-04 DIAGNOSIS — E113299 Type 2 diabetes mellitus with mild nonproliferative diabetic retinopathy without macular edema, unspecified eye: Secondary | ICD-10-CM

## 2020-08-04 DIAGNOSIS — Z794 Long term (current) use of insulin: Secondary | ICD-10-CM

## 2020-08-04 LAB — POCT GLYCOSYLATED HEMOGLOBIN (HGB A1C): Hemoglobin A1C: 7.4 % — AB (ref 4.0–5.6)

## 2020-08-04 NOTE — Progress Notes (Signed)
Name: [name]  Age/ Sex: 54 y.o., male   MRN/ DOB: [id number], 01-31-1966     PCP: Renford Dills, MD   Reason for Endocrinology Evaluation: Type 2 Diabetes Mellitus  Initial Endocrine Consultative Visit: 01/06/2020    PATIENT IDENTIFIER: [name] is a 54 y.o. male with a past medical history of T2DM, Dyslipidemia and Hx of Pancreatitis ( ? Januvia) in 2016 and HTN. The patient has followed with Endocrinology clinic since 01/06/2020 for consultative assistance with management of his diabetes.  DIABETIC HISTORY:   [name] was diagnosed with DM in 1996.  Was initially on metformin until ~ 2010 when he started taking consistently , Glipizide added later. Januvia (Pancreatitis). His hemoglobin A1c has ranged from 7.3% in 04/2019, peaking at 8.5 % in 10/2019   SUBJECTIVE:   During the last visit (03/06/2020): A1c 6.8%, Decrease Tresiba, increased Farxiga and continued metformin  Today (08/04/2020): [name]  He checks his blood sugars 2 times daily, preprandial. The patient has not had hypoglycemic episodes since the last clinic visit.        HOME DIABETES REGIMEN:  Tresiba 28 units daily Farxiga 10 mg daily Metformin 1000 mg twice daily   GLUCOSE LOG:  88 - 172 mg/dL      DIABETIC COMPLICATIONS: Microvascular complications:   Retinopathy (minimal) - resolved per pt 02/2020  Denies: CKD , neuropathy  Last eye exam: Completed 02/23/2020  Macrovascular complications:    Denies: CAD, PVD, CVA   HISTORY:  Past Medical History:  Past Medical History:  Diagnosis Date  . Diabetes mellitus without complication (HCC)   . Hypertension    Past Surgical History:  Past Surgical History:  Procedure Laterality Date  . COLONOSCOPY WITH PROPOFOL N/A 01/13/2017   Procedure: COLONOSCOPY WITH PROPOFOL;  Surgeon: Charolett Bumpers, MD;  Location: [facility];  Service: Endoscopy;  Laterality: N/A;    Social History:  reports that he has never smoked. He has  never used smokeless tobacco. He reports that he does not drink alcohol and does not use drugs. Family History:  Family History  Problem Relation Age of Onset  . Diabetes Mother   . Diabetes Father      HOME MEDICATIONS: Allergies as of 08/04/2020      Reactions   Shellfish Allergy Itching, Other (See Comments)   Scratchy throat   Strawberry (diagnostic) Itching, Other (See Comments)   Scratchy throat      Medication List       Accurate as of August 04, 2020  3:14 PM. If you have any questions, ask your nurse or doctor.        aspirin EC 81 MG tablet Take 81 mg by mouth daily.   Farxiga 10 MG Tabs tablet Generic drug: dapagliflozin propanediol Take 10 mg by mouth daily before breakfast.   Insulin Pen Needle 29G X 12.7MM Misc Use as directed E11.65   lisinopril 10 MG tablet Commonly known as: ZESTRIL Take 10 mg by mouth daily.   metFORMIN 1000 MG tablet Commonly known as: GLUCOPHAGE Take 1 tablet (1,000 mg total) by mouth 2 (two) times daily with a meal.   multivitamin with minerals Tabs tablet Take 1 tablet by mouth daily at 12 noon.   PRILOSEC OTC PO Take by mouth.   simvastatin 10 MG tablet Commonly known as: ZOCOR Take 10 mg by mouth daily at 6 PM.   [name] FlexTouch 100 UNIT/ML FlexTouch Pen Generic drug: insulin degludec Inject 0.28 mLs (28 Units total) into  the skin daily.   UNABLE TO FIND Med Name: livago meter        OBJECTIVE:   Vital Signs: BP 116/80   Pulse 84   Resp 18   Ht 5\' 10"  (1.778 m)   Wt 198 lb 6.4 oz (90 kg)   SpO2 98%   BMI 28.47 kg/m   Wt Readings from Last 3 Encounters:  08/04/20 198 lb 6.4 oz (90 kg)  03/06/20 203 lb 6.4 oz (92.3 kg)  01/06/20 208 lb 9.6 oz (94.6 kg)     Exam: General: Pt appears well and is in NAD  Lungs: Clear with good BS bilat with no rales, rhonchi, or wheezes  Heart: RRR with normal S1 and S2 and no gallops; no murmurs; no rub  Abdomen: Normoactive bowel sounds, soft, nontender,  without masses or organomegaly palpable  Extremities: No pretibial edema.   Skin: Normal texture and temperature to palpation.   Neuro: MS is good with appropriate affect, pt is alert and Ox3    DM foot exam:   08/04/2020  The skin of the feet is intact without sores or ulcerations. The pedal pulses are 2+ on right and 2+ on left. The sensation is intact to a screening 5.07, 10 gram monofilament bilaterally        DATA REVIEWED:  Lab Results  Component Value Date   HGBA1C 7.4 (A) 08/04/2020   HGBA1C 6.8 (A) 03/06/2020   HGBA1C 9.6 (H) 11/26/2015   Lab Results  Component Value Date   LDLCALC 82 11/27/2015   CREATININE 1.29 03/06/2020    Lab Results  Component Value Date   CHOL 136 11/27/2015   HDL 34 (L) 11/27/2015   LDLCALC 82 11/27/2015   TRIG 101 11/27/2015   CHOLHDL 4.0 11/27/2015       Results for Gomez, Mike (MRN Luan Pulling) as of 03/07/2020 08:18  Ref. Range 03/06/2020 08:41  Sodium Latest Ref Range: 135 - 145 mEq/L 136  Potassium Latest Ref Range: 3.5 - 5.1 mEq/L 4.5  Chloride Latest Ref Range: 96 - 112 mEq/L 99  CO2 Latest Ref Range: 19 - 32 mEq/L 28  Glucose Latest Ref Range: 70 - 99 mg/dL 03/08/2020 (H)  BUN Latest Ref Range: 6 - 23 mg/dL 21  Creatinine Latest Ref Range: 0.40 - 1.50 mg/dL 496  Calcium Latest Ref Range: 8.4 - 10.5 mg/dL 9.7  GFR Latest Ref Range: >60.00 mL/min 70.16    ASSESSMENT / PLAN / RECOMMENDATIONS:   1) Type 2 Diabetes Mellitus, Sub- Optimally controlled, With Hx of retinopathic  complications ( resolved by 02/2020) - Most recent A1c of 7.4 %. Goal A1c < 7.0 %.   - A1c slightly elevated, this is most likely due to post-prandial hyperglycemia - He is tolerating all his meds without side effects -Due to his history of pancreatitis secondary to Januvia, DPP- 4 inhibitors and GLP-1 agonists are contraindicated  - We discussed options to improve his A1c to include decreased his carb intake , vs adding pioglitazone ( pt not keen on weight  gain), we also discussed switching Tresiba to an insulin Mix - Pt opted to work on low carb diet, he will stop snacking   MEDICATIONS: - Continue Tresiba 28 units daily  - Continue  Farxiga 10 mg daily with Breakfast  - Continue Metformin 1000 mg Twice daily with Breakfast and supper   EDUCATION / INSTRUCTIONS:  BG monitoring instructions: Patient is instructed to check his blood sugars 1 times a day  Call [facility]  clinic if: BG persistently < 70  . I reviewed the Rule of 15 for the treatment of hypoglycemia in detail with the patient. Literature supplied.     2) Diabetic complications:   Eye: Did have mild DR per pt but this has resolved by 02/2020 exam.  Neuro/ Feet: Does not have known diabetic peripheral neuropathy .   Renal: Patient does not have known baseline CKD. He   is on an ACEI/ARB at present.   F/U in 3 months    Signed electronically by: Lyndle Herrlich, MD  [facility]  [facility] [address] [place], [place] [address] Phone: [phone] FAX: [phone]   CC: Renford Dills, MD [address] [place] [address] Phone: [phone]  Fax: [phone]  Return to Endocrinology clinic as below: No future appointments.

## 2020-08-04 NOTE — Patient Instructions (Addendum)
-   Continue Tresiba 28 units daily  - Continue  Farxiga 10 mg daily with Breakfast  - Continue Metformin 1000 mg Twice daily with Breakfast and supper    Choose healthy, lower carb lower calorie snacks: toss salad,  vegetables, cottage cheese, peanut butter, low fat cheese / string cheese, lower sodium deli meat, tuna salad or chicken salad     HOW TO TREAT LOW BLOOD SUGARS (Blood sugar LESS THAN 70 MG/DL)  Please follow the RULE OF 15 for the treatment of hypoglycemia treatment (when your (blood sugars are less than 70 mg/dL)    STEP 1: Take 15 grams of carbohydrates when your blood sugar is low, which includes:   3-4 GLUCOSE TABS  OR  3-4 OZ OF JUICE OR REGULAR SODA OR  ONE TUBE OF GLUCOSE GEL     STEP 2: RECHECK blood sugar in 15 MINUTES STEP 3: If your blood sugar is still low at the 15 minute recheck --> then, go back to STEP 1 and treat AGAIN with another 15 grams of carbohydrates.

## 2020-09-22 IMAGING — CR DG CHEST 2V
2 series · 2 of 2 positions shown · non-contrast
Comparison: None.

CLINICAL DATA: Central chest pain for 1 day.

EXAM:
CHEST - 2 VIEW

[chest pa]
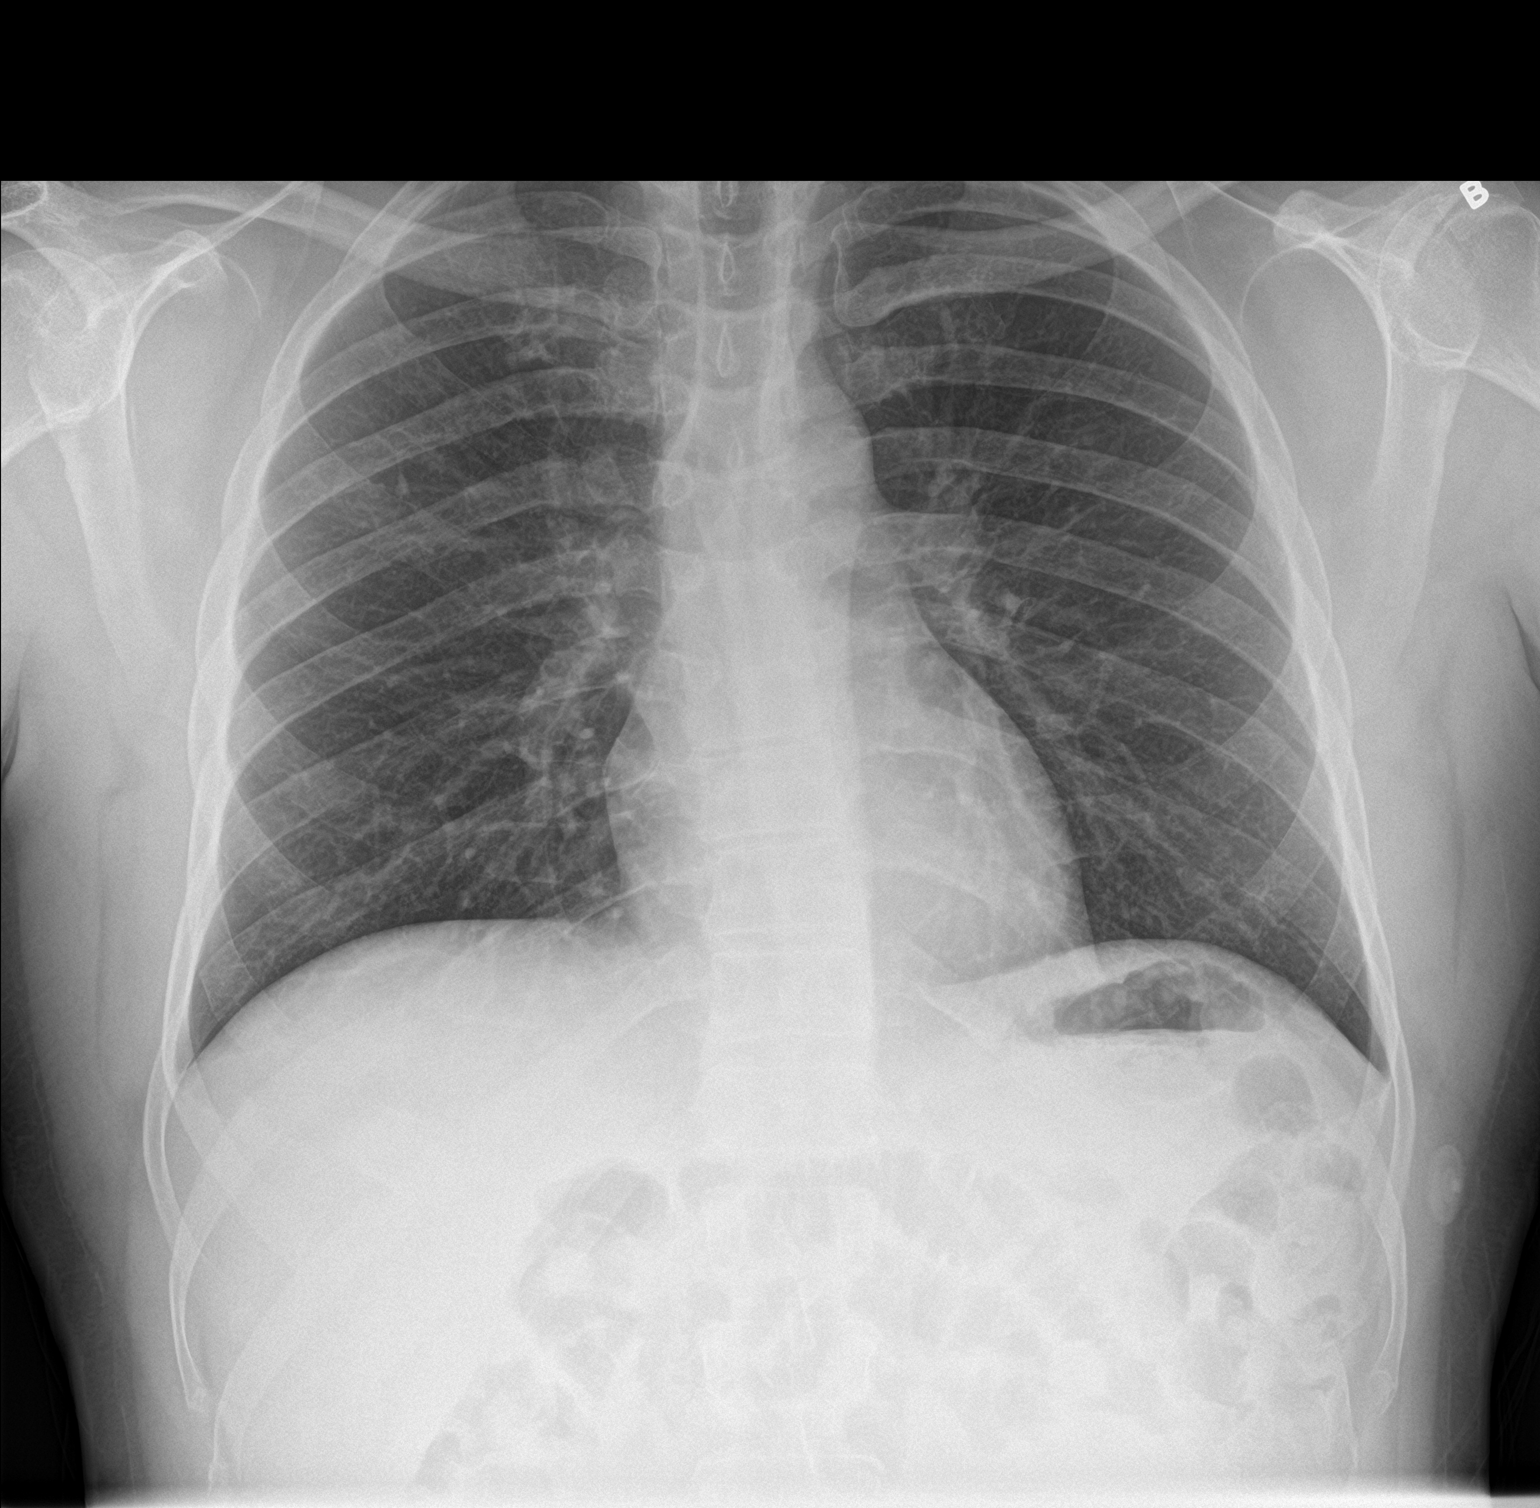

[chest lat]
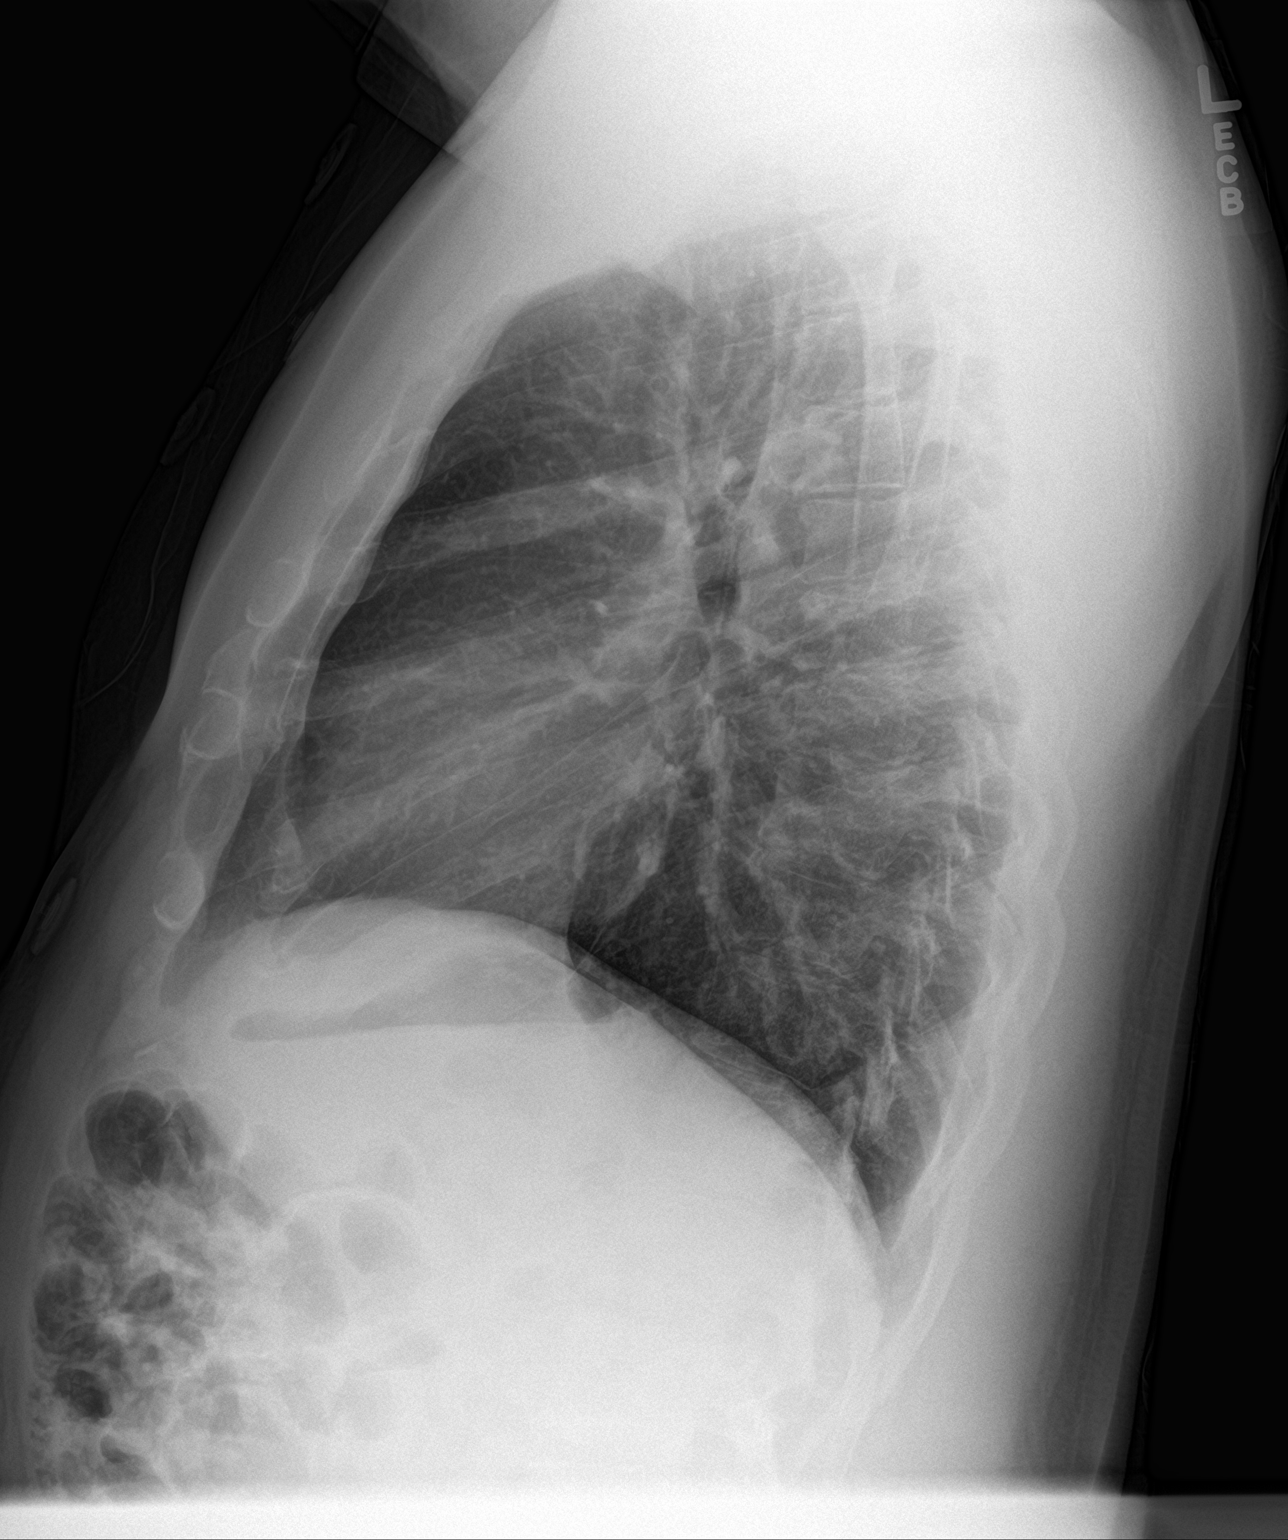

[2 of 2 positions shown; findings below may reference images not displayed]

FINDINGS: The cardiomediastinal contours are normal. The lungs are clear.
Pulmonary vasculature is normal. No consolidation, pleural effusion,
or pneumothorax. No acute osseous abnormalities are seen.
IMPRESSION: Negative radiographs of the chest.

## 2020-10-27 ENCOUNTER — Ambulatory Visit: Payer: BC Managed Care – PPO | Admitting: Internal Medicine

## 2020-10-27 NOTE — Progress Notes (Deleted)
Name: [name]  Age/ Sex: 54 y.o., male   MRN/ DOB: [id number], 08/18/1966     PCP: Renford Dills, MD   Reason for Endocrinology Evaluation: Type 2 Diabetes Mellitus  Initial Endocrine Consultative Visit: 01/06/2020    PATIENT IDENTIFIER: [name] is a 54 y.o. male with a past medical history of T2DM, Dyslipidemia and Hx of Pancreatitis ( ? Januvia) in 2016 and HTN. The patient has followed with Endocrinology clinic since 01/06/2020 for consultative assistance with management of his diabetes.  DIABETIC HISTORY:   [name] was diagnosed with DM in 1996.  Was initially on metformin until ~ 2010 when he started taking consistently , Glipizide added later. Januvia (Pancreatitis). His hemoglobin A1c has ranged from 7.3% in 04/2019, peaking at 8.5 % in 10/2019   SUBJECTIVE:   During the last visit (08/04/2020): A1c 7.4%, No changes we made and we continues [place] and metformin. He opted for lifestyle changes rather then starting pioglitazone   Today (10/27/2020): [name]  He checks his blood sugars 2 times daily, preprandial. The patient has not had hypoglycemic episodes since the last clinic visit.     HOME DIABETES REGIMEN:  Tresiba 28 units daily Farxiga 10 mg daily Metformin 1000 mg twice daily   GLUCOSE LOG:  88 - 172 mg/dL      DIABETIC COMPLICATIONS: Microvascular complications:   Retinopathy (minimal) - resolved per pt 02/2020  Denies: CKD , neuropathy  Last eye exam: Completed 02/23/2020  Macrovascular complications:    Denies: CAD, PVD, CVA   HISTORY:  Past Medical History:  Past Medical History:  Diagnosis Date  . Diabetes mellitus without complication (HCC)   . Hypertension    Past Surgical History:  Past Surgical History:  Procedure Laterality Date  . COLONOSCOPY WITH PROPOFOL N/A 01/13/2017   Procedure: COLONOSCOPY WITH PROPOFOL;  Surgeon: Charolett Bumpers, MD;  Location: [facility];  Service: Endoscopy;  Laterality:  N/A;    Social History:  reports that he has never smoked. He has never used smokeless tobacco. He reports that he does not drink alcohol and does not use drugs. Family History:  Family History  Problem Relation Age of Onset  . Diabetes Mother   . Diabetes Father      HOME MEDICATIONS: Allergies as of 10/27/2020      Reactions   Shellfish Allergy Itching, Other (See Comments)   Scratchy throat   Strawberry (diagnostic) Itching, Other (See Comments)   Scratchy throat      Medication List       Accurate as of October 27, 2020  7:26 AM. If you have any questions, ask your nurse or doctor.        aspirin EC 81 MG tablet Take 81 mg by mouth daily.   Farxiga 10 MG Tabs tablet Generic drug: dapagliflozin propanediol Take 10 mg by mouth daily before breakfast.   Insulin Pen Needle 29G X 12.7MM Misc Use as directed E11.65   lisinopril 10 MG tablet Commonly known as: ZESTRIL Take 10 mg by mouth daily.   metFORMIN 1000 MG tablet Commonly known as: GLUCOPHAGE Take 1 tablet (1,000 mg total) by mouth 2 (two) times daily with a meal.   multivitamin with minerals Tabs tablet Take 1 tablet by mouth daily at 12 noon.   PRILOSEC OTC PO Take by mouth.   simvastatin 10 MG tablet Commonly known as: ZOCOR Take 10 mg by mouth daily at 6 PM.   Evaristo Bury FlexTouch 100 UNIT/ML FlexTouch Pen  Generic drug: insulin degludec Inject 0.28 mLs (28 Units total) into the skin daily.   UNABLE TO FIND Med Name: livago meter        OBJECTIVE:   Vital Signs: There were no vitals taken for this visit.  Wt Readings from Last 3 Encounters:  08/04/20 198 lb 6.4 oz (90 kg)  03/06/20 203 lb 6.4 oz (92.3 kg)  01/06/20 208 lb 9.6 oz (94.6 kg)     Exam: General: Pt appears well and is in NAD  Lungs: Clear with good BS bilat with no rales, rhonchi, or wheezes  Heart: RRR with normal S1 and S2 and no gallops; no murmurs; no rub  Abdomen: Normoactive bowel sounds, soft, nontender, without  masses or organomegaly palpable  Extremities: No pretibial edema.   Skin: Normal texture and temperature to palpation.   Neuro: MS is good with appropriate affect, pt is alert and Ox3    DM foot exam:   08/04/2020  The skin of the feet is intact without sores or ulcerations. The pedal pulses are 2+ on right and 2+ on left. The sensation is intact to a screening 5.07, 10 gram monofilament bilaterally        DATA REVIEWED:  Lab Results  Component Value Date   HGBA1C 7.4 (A) 08/04/2020   HGBA1C 6.8 (A) 03/06/2020   HGBA1C 9.6 (H) 11/26/2015   Lab Results  Component Value Date   LDLCALC 82 11/27/2015   CREATININE 1.29 03/06/2020    Lab Results  Component Value Date   CHOL 136 11/27/2015   HDL 34 (L) 11/27/2015   LDLCALC 82 11/27/2015   TRIG 101 11/27/2015   CHOLHDL 4.0 11/27/2015       Results for GLACE, Bradford (MRN [id number]) as of 03/07/2020 08:18  Ref. Range 03/06/2020 08:41  Sodium Latest Ref Range: 135 - 145 mEq/L 136  Potassium Latest Ref Range: 3.5 - 5.1 mEq/L 4.5  Chloride Latest Ref Range: 96 - 112 mEq/L 99  CO2 Latest Ref Range: 19 - 32 mEq/L 28  Glucose Latest Ref Range: 70 - 99 mg/dL 195 (H)  BUN Latest Ref Range: 6 - 23 mg/dL 21  Creatinine Latest Ref Range: 0.40 - 1.50 mg/dL 0.93  Calcium Latest Ref Range: 8.4 - 10.5 mg/dL 9.7  GFR Latest Ref Range: >60.00 mL/min 70.16    ASSESSMENT / PLAN / RECOMMENDATIONS:   1) Type 2 Diabetes Mellitus, Sub- Optimally controlled, With Hx of retinopathic  complications ( resolved by 02/2020) - Most recent A1c of 7.4 %. Goal A1c < 7.0 %.   - A1c slightly elevated, this is most likely due to post-prandial hyperglycemia - He is tolerating all his meds without side effects -Due to his history of pancreatitis secondary to Januvia, DPP- 4 inhibitors and GLP-1 agonists are contraindicated  - We discussed options to improve his A1c to include decreased his carb intake , vs adding pioglitazone ( pt not keen on weight gain), we  also discussed switching Tresiba to an insulin Mix - Pt opted to work on low carb diet, he will stop snacking   MEDICATIONS: - Continue Tresiba 28 units daily  - Continue  Farxiga 10 mg daily with Breakfast  - Continue Metformin 1000 mg Twice daily with Breakfast and supper   EDUCATION / INSTRUCTIONS:  BG monitoring instructions: Patient is instructed to check his blood sugars 1 times a day  Call [place] [facility] clinic if: BG persistently < 70  . I reviewed the Rule of 15 for the treatment  of hypoglycemia in detail with the patient. Literature supplied.     2) Diabetic complications:   Eye: Did have mild DR per pt but this has resolved by 02/2020 exam.  Neuro/ Feet: Does not have known diabetic peripheral neuropathy .   Renal: Patient does not have known baseline CKD. He   is on an ACEI/ARB at present.   F/U in 3 months    Signed electronically by: Lyndle Herrlich, MD  [facility] Endocrinology  [facility] [address] [place], [place] [address] Phone: [phone] FAX: [phone]   CC: Renford Dills, MD [address] [place] [address] Phone: [phone]  Fax: [phone]  Return to Endocrinology clinic as below: Future Appointments  Date Time Provider Department Center  10/27/2020  7:30 AM Swayzie Choate, Konrad Dolores, MD [id number] None

## 2020-11-08 ENCOUNTER — Encounter: Payer: Self-pay | Admitting: Internal Medicine

## 2020-11-08 ENCOUNTER — Ambulatory Visit (INDEPENDENT_AMBULATORY_CARE_PROVIDER_SITE_OTHER): Payer: BC Managed Care – PPO | Admitting: Internal Medicine

## 2020-11-08 ENCOUNTER — Other Ambulatory Visit: Payer: Self-pay

## 2020-11-08 VITALS — BP 118/78 | HR 81 | Ht 70.0 in | Wt 198.0 lb

## 2020-11-08 DIAGNOSIS — E1165 Type 2 diabetes mellitus with hyperglycemia: Secondary | ICD-10-CM | POA: Diagnosis not present

## 2020-11-08 DIAGNOSIS — Z794 Long term (current) use of insulin: Secondary | ICD-10-CM | POA: Diagnosis not present

## 2020-11-08 LAB — POCT GLYCOSYLATED HEMOGLOBIN (HGB A1C): Hemoglobin A1C: 7.3 % — AB (ref 4.0–5.6)

## 2020-11-08 NOTE — Patient Instructions (Signed)
-   Continue Tresiba 28 units daily  - Continue  Farxiga 10 mg daily with Breakfast  - Continue Metformin 1000 mg Twice daily with Breakfast and supper        HOW TO TREAT LOW BLOOD SUGARS (Blood sugar LESS THAN 70 MG/DL)  Please follow the RULE OF 15 for the treatment of hypoglycemia treatment (when your (blood sugars are less than 70 mg/dL)    STEP 1: Take 15 grams of carbohydrates when your blood sugar is low, which includes:   3-4 GLUCOSE TABS  OR  3-4 OZ OF JUICE OR REGULAR SODA OR  ONE TUBE OF GLUCOSE GEL     STEP 2: RECHECK blood sugar in 15 MINUTES STEP 3: If your blood sugar is still low at the 15 minute recheck --> then, go back to STEP 1 and treat AGAIN with another 15 grams of carbohydrates.

## 2020-11-08 NOTE — Progress Notes (Signed)
Name: [name]  Age/ Sex: 54 y.o., male   MRN/ DOB: [id number], Sep 06, 1966     PCP: Renford Dills, MD   Reason for Endocrinology Evaluation: Type 2 Diabetes Mellitus  Initial Endocrine Consultative Visit: 01/06/2020    PATIENT IDENTIFIER: [name] is a 54 y.o. male with a past medical history of T2DM, Dyslipidemia and Hx of Pancreatitis ( ? Januvia) in 2016 and HTN. The patient has followed with Endocrinology clinic since 01/06/2020 for consultative assistance with management of his diabetes.  DIABETIC HISTORY:   [name] was diagnosed with DM in 1996.  Was initially on metformin until ~ 2010 when he started taking consistently , Glipizide added later. Januvia (Pancreatitis). His hemoglobin A1c has ranged from 7.3% in 04/2019, peaking at 8.5 % in 10/2019   SUBJECTIVE:   During the last visit (08/04/2020): A1c 7.4%, No changes we made and we continues [place] and metformin. He opted for lifestyle changes rather then starting pioglitazone   Today (11/08/2020): [name]  He checks his blood sugars 2 times daily, preprandial. The patient has not had hypoglycemic episodes since the last clinic visit.   Denies sob, abdominal or nausea   HOME DIABETES REGIMEN:  Tresiba 28 units daily Farxiga 10 mg daily Metformin 1000 mg twice daily     GLUCOSE LOG:  83- 324 mg/dL      DIABETIC COMPLICATIONS: Microvascular complications:   Retinopathy (minimal) - resolved per pt 02/2020  Denies: CKD , neuropathy  Last eye exam: Completed 02/23/2020  Macrovascular complications:    Denies: CAD, PVD, CVA   HISTORY:  Past Medical History:  Past Medical History:  Diagnosis Date  . Diabetes mellitus without complication (HCC)   . Hypertension    Past Surgical History:  Past Surgical History:  Procedure Laterality Date  . COLONOSCOPY WITH PROPOFOL N/A 01/13/2017   Procedure: COLONOSCOPY WITH PROPOFOL;  Surgeon: Charolett Bumpers, MD;  Location: [facility];   Service: Endoscopy;  Laterality: N/A;    Social History:  reports that he has never smoked. He has never used smokeless tobacco. He reports that he does not drink alcohol and does not use drugs. Family History:  Family History  Problem Relation Age of Onset  . Diabetes Mother   . Diabetes Father      HOME MEDICATIONS: Allergies as of 11/08/2020      Reactions   Shellfish Allergy Itching, Other (See Comments)   Scratchy throat   Strawberry (diagnostic) Itching, Other (See Comments)   Scratchy throat      Medication List       Accurate as of November 08, 2020  1:22 PM. If you have any questions, ask your nurse or doctor.        aspirin EC 81 MG tablet Take 81 mg by mouth daily.   Farxiga 10 MG Tabs tablet Generic drug: dapagliflozin propanediol Take 10 mg by mouth daily before breakfast.   Insulin Pen Needle 29G X 12.7MM Misc Use as directed E11.65   lisinopril 10 MG tablet Commonly known as: ZESTRIL Take 10 mg by mouth daily.   metFORMIN 1000 MG tablet Commonly known as: GLUCOPHAGE Take 1 tablet (1,000 mg total) by mouth 2 (two) times daily with a meal.   multivitamin with minerals Tabs tablet Take 1 tablet by mouth daily at 12 noon.   PRILOSEC OTC PO Take by mouth.   simvastatin 10 MG tablet Commonly known as: ZOCOR Take 10 mg by mouth daily at 6 PM.  [name] FlexTouch 100 UNIT/ML FlexTouch Pen Generic drug: insulin degludec Inject 0.28 mLs (28 Units total) into the skin daily.   UNABLE TO FIND Med Name: livago meter        OBJECTIVE:   Vital Signs: BP 118/78   Pulse 81   Ht 5\' 10"  (1.778 m)   Wt 198 lb (89.8 kg)   SpO2 97%   BMI 28.41 kg/m   Wt Readings from Last 3 Encounters:  11/08/20 198 lb (89.8 kg)  08/04/20 198 lb 6.4 oz (90 kg)  03/06/20 203 lb 6.4 oz (92.3 kg)     Exam: General: Pt appears well and is in NAD  Lungs: Clear with good BS bilat with no rales, rhonchi, or wheezes  Heart: RRR with normal S1 and S2 and no  gallops; no murmurs; no rub  Abdomen: Normoactive bowel sounds, soft, nontender, without masses or organomegaly palpable  Extremities: No pretibial edema.   Skin: Normal texture and temperature to palpation.   Neuro: MS is good with appropriate affect, pt is alert and Ox3    DM foot exam:   08/04/2020  The skin of the feet is intact without sores or ulcerations. The pedal pulses are 2+ on right and 2+ on left. The sensation is intact to a screening 5.07, 10 gram monofilament bilaterally        DATA REVIEWED:  Lab Results  Component Value Date   HGBA1C 7.3 (A) 11/08/2020   HGBA1C 7.4 (A) 08/04/2020   HGBA1C 6.8 (A) 03/06/2020   Lab Results  Component Value Date   LDLCALC 82 11/27/2015   CREATININE 1.29 03/06/2020    Lab Results  Component Value Date   CHOL 136 11/27/2015   HDL 34 (L) 11/27/2015   LDLCALC 82 11/27/2015   TRIG 101 11/27/2015   CHOLHDL 4.0 11/27/2015       Results for [name], [name] (MRN Luan Pulling) as of 03/07/2020 08:18  Ref. Range 03/06/2020 08:41  Sodium Latest Ref Range: 135 - 145 mEq/L 136  Potassium Latest Ref Range: 3.5 - 5.1 mEq/L 4.5  Chloride Latest Ref Range: 96 - 112 mEq/L 99  CO2 Latest Ref Range: 19 - 32 mEq/L 28  Glucose Latest Ref Range: 70 - 99 mg/dL 03/08/2020 (H)  BUN Latest Ref Range: 6 - 23 mg/dL 21  Creatinine Latest Ref Range: 0.40 - 1.50 mg/dL 628  Calcium Latest Ref Range: 8.4 - 10.5 mg/dL 9.7  GFR Latest Ref Range: >60.00 mL/min 70.16    ASSESSMENT / PLAN / RECOMMENDATIONS:   1) Type 2 Diabetes Mellitus, Sub- Optimally controlled, With Hx of retinopathic  complications ( resolved by 02/2020) - Most recent A1c of 7.3 %. Goal A1c < 7.0 %.    - He is tolerating all his meds without side effects -Due to his history of pancreatitis secondary to Januvia, DPP- 4 inhibitors and GLP-1 agonists are contraindicated  - We had discussed adding  Pioglitazone if needed, but pt not keen due to risk of weight gain   - NO changes today    MEDICATIONS: - Continue Tresiba 28 units daily  - Continue  Farxiga 10 mg daily with Breakfast  - Continue Metformin 1000 mg Twice daily with Breakfast and supper   EDUCATION / INSTRUCTIONS:  BG monitoring instructions: Patient is instructed to check his blood sugars 1 times a day  Call [place] [facility] clinic if: BG persistently < 70  . I reviewed the Rule of 15 for the treatment of hypoglycemia in detail with the patient.  Literature supplied.     2) Diabetic complications:   Eye: Did have mild DR per pt but this has resolved by 02/2020 exam.  Neuro/ Feet: Does not have known diabetic peripheral neuropathy .   Renal: Patient does not have known baseline CKD. He   is on an ACEI/ARB at present.   F/U in 6 months    Signed electronically by: Lyndle Herrlich, MD  [facility] Endocrinology  [facility] [address] [place], [place] [address] Phone: [phone] FAX: [phone]   CC: Renford Dills, MD [address] [place] [address] Phone: [phone]  Fax: [phone]  Return to Endocrinology clinic as below: No future appointments.

## 2021-01-07 ENCOUNTER — Other Ambulatory Visit: Payer: Self-pay | Admitting: Internal Medicine

## 2021-05-08 NOTE — Progress Notes (Signed)
Name: [name]  Age/ Sex: 55 y.o., male   MRN/ DOB: [id number], November 24, 1966     PCP: Renford Dills, MD   Reason for Endocrinology Evaluation: Type 2 Diabetes Mellitus  Initial Endocrine Consultative Visit: 01/06/2020    PATIENT IDENTIFIER: [name] is a 55 y.o. male with a past medical history of T2DM, Dyslipidemia and Hx of Pancreatitis ( ? Januvia) in 2016 and HTN. The patient has followed with Endocrinology clinic since 01/06/2020 for consultative assistance with management of his diabetes.  DIABETIC HISTORY:   [name] was diagnosed with DM in 1996.  Was initially on metformin until ~ 2010 when he started taking consistently , Glipizide added later. Januvia (Pancreatitis). His hemoglobin A1c has ranged from 7.3% in 04/2019, peaking at 8.5 % in 10/2019   SUBJECTIVE:   During the last visit (11/08/2020): A1c 7.3%, continued Tresiba, Farxiga and metformin.     Today (05/09/2021): [name] is here for a follow up on diabetes management.  He checks his blood sugars 2 times daily, preprandial. The patient has not had hypoglycemic episodes since the last clinic visit.   Denies nausea or vomiting  He has been noted with weight gain   HOME DIABETES REGIMEN:  Tresiba 28 units daily Farxiga 10 mg daily Metformin 1000 mg twice daily      GLUCOSE LOG:  83-  235 mg/dL      DIABETIC COMPLICATIONS: Microvascular complications:   Retinopathy (minimal) - resolved per pt 02/2020  Denies: CKD , neuropathy  Last eye exam: Completed 02/23/2020   Macrovascular complications:    Denies: CAD, PVD, CVA   HISTORY:  Past Medical History:  Past Medical History:  Diagnosis Date  . Diabetes mellitus without complication (HCC)   . Hypertension    Past Surgical History:  Past Surgical History:  Procedure Laterality Date  . COLONOSCOPY WITH PROPOFOL N/A 01/13/2017   Procedure: COLONOSCOPY WITH PROPOFOL;  Surgeon: Charolett Bumpers, MD;  Location: [facility];   Service: Endoscopy;  Laterality: N/A;    Social History:  reports that he has never smoked. He has never used smokeless tobacco. He reports that he does not drink alcohol and does not use drugs. Family History:  Family History  Problem Relation Age of Onset  . Diabetes Mother   . Diabetes Father      HOME MEDICATIONS: Allergies as of 05/09/2021      Reactions   Shellfish Allergy Itching, Other (See Comments)   Scratchy throat   Strawberry (diagnostic) Itching, Other (See Comments)   Scratchy throat      Medication List       Accurate as of May 09, 2021  7:42 AM. If you have any questions, ask your nurse or doctor.        aspirin EC 81 MG tablet Take 81 mg by mouth daily.   Farxiga 10 MG Tabs tablet Generic drug: dapagliflozin propanediol TAKE 1 TABLET BY MOUTH DAILY BEFORE BREAKFAST   Insulin Pen Needle 29G X 12.7MM Misc Use as directed E11.65   lisinopril 10 MG tablet Commonly known as: ZESTRIL Take 10 mg by mouth daily.   metFORMIN 1000 MG tablet Commonly known as: GLUCOPHAGE TAKE 1 TABLET BY MOUTH TWICE A DAY WITH MEAL   multivitamin with minerals Tabs tablet Take 1 tablet by mouth daily at 12 noon.   PRILOSEC OTC PO Take by mouth.   simvastatin 10 MG tablet Commonly known as: ZOCOR Take 10 mg by mouth daily at 6 PM.  Evaristo Bury FlexTouch 100 UNIT/ML FlexTouch Pen Generic drug: insulin degludec Inject 0.28 mLs (28 Units total) into the skin daily.   UNABLE TO FIND Med Name: livago meter        OBJECTIVE:   Vital Signs: BP 134/82   Pulse 75   Ht 5\' 10"  (1.778 m)   Wt 204 lb 4 oz (92.6 kg)   SpO2 98%   BMI 29.31 kg/m   Wt Readings from Last 3 Encounters:  05/09/21 204 lb 4 oz (92.6 kg)  11/08/20 198 lb (89.8 kg)  08/04/20 198 lb 6.4 oz (90 kg)     Exam: General: Pt appears well and is in NAD  Lungs: Clear with good BS bilat with no rales, rhonchi, or wheezes  Heart: RRR with normal S1 and S2 and no gallops; no murmurs; no rub   Abdomen: Normoactive bowel sounds, soft, nontender, without masses or organomegaly palpable  Extremities: No pretibial edema.   Neuro: MS is good with appropriate affect, pt is alert and Ox3    DM foot exam:   08/04/2020  The skin of the feet is intact without sores or ulcerations. The pedal pulses are 2+ on right and 2+ on left. The sensation is intact to a screening 5.07, 10 gram monofilament bilaterally    DATA REVIEWED:  Lab Results  Component Value Date   HGBA1C 7.6 (A) 05/09/2021   HGBA1C 7.3 (A) 11/08/2020   HGBA1C 7.4 (A) 08/04/2020   Lab Results  Component Value Date   LDLCALC 82 11/27/2015   CREATININE 1.29 03/06/2020    Lab Results  Component Value Date   CHOL 136 11/27/2015   HDL 34 (L) 11/27/2015   LDLCALC 82 11/27/2015   TRIG 101 11/27/2015   CHOLHDL 4.0 11/27/2015       Results for [name], [name] (MRN Luan Pulling) as of 03/07/2020 08:18  Ref. Range 03/06/2020 08:41  Sodium Latest Ref Range: 135 - 145 mEq/L 136  Potassium Latest Ref Range: 3.5 - 5.1 mEq/L 4.5  Chloride Latest Ref Range: 96 - 112 mEq/L 99  CO2 Latest Ref Range: 19 - 32 mEq/L 28  Glucose Latest Ref Range: 70 - 99 mg/dL 03/08/2020 (H)  BUN Latest Ref Range: 6 - 23 mg/dL 21  Creatinine Latest Ref Range: 0.40 - 1.50 mg/dL 924  Calcium Latest Ref Range: 8.4 - 10.5 mg/dL 9.7  GFR Latest Ref Range: >60.00 mL/min 70.16    ASSESSMENT / PLAN / RECOMMENDATIONS:   1) Type 2 Diabetes Mellitus, Sub- Optimally controlled, With Hx of retinopathic  complications ( resolved by 02/2020) - Most recent A1c of 7.6 %. Goal A1c < 7.0 %.    - He is tolerating all his meds without side effects -Due to his history of pancreatitis secondary to Januvia, DPP- 4 inhibitors and GLP-1 agonists are contraindicated  - He has been noted with weight gain and fasting hyperglycemia most day > 140 mg/dL , He has been adding fat to his diet, I have discouraged him from this , as this makes him more insulin resistant. He declined a  referral to our RD  - He agreed to start Pioglitazone, cautioned against weight gain and edema   MEDICATIONS: - Continue Tresiba 28 units daily  - Continue  Farxiga 10 mg daily with Breakfast  - Continue Metformin 1000 mg Twice daily with Breakfast and supper  - Start Pioglitazone 15 mg daily   EDUCATION / INSTRUCTIONS:  BG monitoring instructions: Patient is instructed to check his blood sugars 1 times  a day  Call [facility] Endocrinology clinic if: BG persistently < 70  . I reviewed the Rule of 15 for the treatment of hypoglycemia in detail with the patient. Literature supplied.     2) Diabetic complications:   Eye: Did have mild DR per pt but this has resolved by 02/2020 exam.  Neuro/ Feet: Does not have known diabetic peripheral neuropathy .   Renal: Patient does not have known baseline CKD. He   is on an ACEI/ARB at present.  3) Dyslipidemia :  LDL above goal at 118 mg/dL in 67/1245. Will switched Simvastatin to Atorvastatin   Stop Simvastatin 10 mg daily  Start  Atorvastatin 20 mg daily     F/U in 4 months    Signed electronically by: Lyndle Herrlich, MD  [facility] Endocrinology  [facility] [address] [place], [place] [address] Phone: [phone] FAX: [phone]   CC: Renford Dills, MD [address] [place] [address] Phone: [phone]  Fax: [phone]  Return to Endocrinology clinic as below: No future appointments.

## 2021-05-09 ENCOUNTER — Other Ambulatory Visit: Payer: Self-pay

## 2021-05-09 ENCOUNTER — Ambulatory Visit: Payer: BC Managed Care – PPO | Admitting: Internal Medicine

## 2021-05-09 ENCOUNTER — Encounter: Payer: Self-pay | Admitting: Internal Medicine

## 2021-05-09 VITALS — BP 134/82 | HR 75 | Ht 70.0 in | Wt 204.2 lb

## 2021-05-09 DIAGNOSIS — E785 Hyperlipidemia, unspecified: Secondary | ICD-10-CM | POA: Diagnosis not present

## 2021-05-09 DIAGNOSIS — Z794 Long term (current) use of insulin: Secondary | ICD-10-CM | POA: Diagnosis not present

## 2021-05-09 DIAGNOSIS — E1165 Type 2 diabetes mellitus with hyperglycemia: Secondary | ICD-10-CM | POA: Diagnosis not present

## 2021-05-09 LAB — POCT GLYCOSYLATED HEMOGLOBIN (HGB A1C): Hemoglobin A1C: 7.6 % — AB (ref 4.0–5.6)

## 2021-05-09 LAB — MICROALBUMIN / CREATININE URINE RATIO
Creatinine,U: 40 mg/dL
Microalb Creat Ratio: 1.7 mg/g (ref 0.0–30.0)
Microalb, Ur: <0.7 mg/dL (ref 0.0–1.9)

## 2021-05-09 MED ORDER — ATORVASTATIN CALCIUM 20 MG PO TABS: 20.0000 mg | ORAL_TABLET | Freq: Every day | ORAL | 3 refills | Status: DC

## 2021-05-09 MED ORDER — PIOGLITAZONE HCL 15 MG PO TABS: 15.0000 mg | ORAL_TABLET | Freq: Every day | ORAL | 1 refills | Status: DC

## 2021-05-09 NOTE — Patient Instructions (Addendum)
-   Continue Tresiba 28 units daily  - Continue  Farxiga 10 mg daily with Breakfast  - Continue Metformin 1000 mg Twice daily with Breakfast and supper   - Start Pioglitazone 15 mg , 1 tablet daily      HOW TO TREAT LOW BLOOD SUGARS (Blood sugar LESS THAN 70 MG/DL)  Please follow the RULE OF 15 for the treatment of hypoglycemia treatment (when your (blood sugars are less than 70 mg/dL)    STEP 1: Take 15 grams of carbohydrates when your blood sugar is low, which includes:   3-4 GLUCOSE TABS  OR  3-4 OZ OF JUICE OR REGULAR SODA OR  ONE TUBE OF GLUCOSE GEL     STEP 2: RECHECK blood sugar in 15 MINUTES STEP 3: If your blood sugar is still low at the 15 minute recheck --> then, go back to STEP 1 and treat AGAIN with another 15 grams of carbohydrates.

## 2021-05-10 ENCOUNTER — Other Ambulatory Visit: Payer: Self-pay | Admitting: Internal Medicine

## 2021-05-17 ENCOUNTER — Encounter: Payer: Self-pay | Admitting: Internal Medicine

## 2021-06-25 ENCOUNTER — Other Ambulatory Visit: Payer: Self-pay | Admitting: Internal Medicine

## 2021-09-11 NOTE — Progress Notes (Signed)
Name: [name]  Age/ Sex: 55 y.o., male   MRN/ DOB: [id number], [birth year]     PCP: Renford Dills, MD   Reason for Endocrinology Evaluation: Type 2 Diabetes Mellitus  Initial Endocrine Consultative Visit: 01/06/2020    PATIENT IDENTIFIER: [name] is a 54 y.o. male with a past medical history of T2DM, Dyslipidemia and Hx of Pancreatitis ( ? Januvia) in 2016 and HTN. The patient has followed with Endocrinology clinic since 01/06/2020 for consultative assistance with management of his diabetes.  DIABETIC HISTORY:   Mr. Stork was diagnosed with DM in 1996.  Was initially on metformin until ~ 2010 when he started taking consistently , Glipizide added later. Januvia (Pancreatitis). His hemoglobin A1c has ranged from 7.3% in 04/2019, peaking at 8.5 % in 10/2019    Started Pioglitazone 04/2021  SUBJECTIVE:   During the last visit (05/09/2021) : A1c 7.6%, continued Tresiba, Farxiga and metformin.     Today (09/12/2021): [name] is here for a follow up on diabetes management.  He checks his blood sugars 2 times daily, preprandial. The patient has not had hypoglycemic episodes since the last clinic visit.   Denies nausea or vomiting  Has been noted with weight loss  No LE edema , noted what looks like a bug bite on the left shin, pt will monitor       HOME DIABETES REGIMEN:  Tresiba 28 units daily Farxiga 10 mg daily Metformin 1000 mg twice daily Pioglitazone 15 mg daily      GLUCOSE LOG: 09/12/2021 Average 110 mg/dL  High 453 Low 66  SD 43     DIABETIC COMPLICATIONS: Microvascular complications:  Retinopathy (minimal) - resolved per pt 02/2020 Denies: CKD , neuropathy Last eye exam: Completed 11/2020    Macrovascular complications:    Denies: CAD, PVD, CVA    HISTORY:  Past Medical History:  Past Medical History:  Diagnosis Date   Diabetes mellitus without complication (HCC)    Hypertension    Past Surgical History:  Past Surgical History:   Procedure Laterality Date   COLONOSCOPY WITH PROPOFOL N/A 01/13/2017   Procedure: COLONOSCOPY WITH PROPOFOL;  Surgeon: Charolett Bumpers, MD;  Location: [facility];  Service: Endoscopy;  Laterality: N/A;   Social History:  reports that he has never smoked. He has never used smokeless tobacco. He reports that he does not drink alcohol and does not use drugs. Family History:  Family History  Problem Relation Age of Onset   Diabetes Mother    Diabetes Father      HOME MEDICATIONS: Allergies as of 09/12/2021       Reactions   Shellfish Allergy Itching, Other (See Comments)   Scratchy throat   Strawberry (diagnostic) Itching, Other (See Comments)   Scratchy throat        Medication List        Accurate as of September 12, 2021  7:52 AM. If you have any questions, ask your nurse or doctor.          aspirin EC 81 MG tablet Take 81 mg by mouth daily.   atorvastatin 20 MG tablet Commonly known as: LIPITOR Take 1 tablet (20 mg total) by mouth daily.   BD ULTRA-FINE PEN NEEDLES 29G X 12.7MM Misc Generic drug: Insulin Pen Needle USE AS DIRECTED E11.65   Farxiga 10 MG Tabs tablet Generic drug: dapagliflozin propanediol TAKE 1 TABLET BY MOUTH DAILY BEFORE BREAKFAST   lisinopril 10 MG tablet Commonly known as: ZESTRIL Take 10 mg  by mouth daily.   metFORMIN 1000 MG tablet Commonly known as: GLUCOPHAGE TAKE 1 TABLET BY MOUTH TWICE A DAY WITH MEAL   multivitamin with minerals Tabs tablet Take 1 tablet by mouth daily at 12 noon.   pioglitazone 15 MG tablet Commonly known as: Actos Take 1 tablet (15 mg total) by mouth daily.   PRILOSEC OTC PO Take by mouth.   Evaristo Bury FlexTouch 100 UNIT/ML FlexTouch Pen Generic drug: insulin degludec Inject 28 Units into the skin daily.   UNABLE TO FIND Med Name: livago meter         OBJECTIVE:   Vital Signs: BP 136/84 (BP Location: Left Arm, Patient Position: Sitting, Cuff Size: Small)   Pulse 87   Ht 5\' 10"  (1.778 m)    Wt 193 lb 3.2 oz (87.6 kg)   SpO2 99%   BMI 27.72 kg/m   Wt Readings from Last 3 Encounters:  09/12/21 193 lb 3.2 oz (87.6 kg)  05/09/21 204 lb 4 oz (92.6 kg)  11/08/20 198 lb (89.8 kg)     Exam: General: Pt appears well and is in NAD  Lungs: Clear with good BS bilat with no rales, rhonchi, or wheezes  Heart: RRR with normal S1 and S2 and no gallops; no murmurs; no rub  Abdomen: Normoactive bowel sounds, soft, nontender, without masses or organomegaly palpable  Extremities: No pretibial edema. Has a tiny red mark on the left shin , looks like a bug bite   Neuro: MS is good with appropriate affect, pt is alert and Ox3    DM foot exam:   09/12/2021  The skin of the feet is intact without sores or ulcerations. The pedal pulses are 2+ on right and 2+ on left. The sensation is intact to a screening 5.07, 10 gram monofilament bilaterally    DATA REVIEWED:  Lab Results  Component Value Date   HGBA1C 7.0 (A) 09/12/2021   HGBA1C 7.6 (A) 05/09/2021   HGBA1C 7.3 (A) 11/08/2020   Lab Results  Component Value Date   MICROALBUR <0.7 05/09/2021   LDLCALC 82 11/27/2015   CREATININE 1.29 03/06/2020    Lab Results  Component Value Date   CHOL 136 11/27/2015   HDL 34 (L) 11/27/2015   LDLCALC 82 11/27/2015   TRIG 101 11/27/2015   CHOLHDL 4.0 11/27/2015       Results for COWDREY, Armstrong (MRN Luan Pulling) as of 03/07/2020 08:18  Ref. Range 03/06/2020 08:41  Sodium Latest Ref Range: 135 - 145 mEq/L 136  Potassium Latest Ref Range: 3.5 - 5.1 mEq/L 4.5  Chloride Latest Ref Range: 96 - 112 mEq/L 99  CO2 Latest Ref Range: 19 - 32 mEq/L 28  Glucose Latest Ref Range: 70 - 99 mg/dL 03/08/2020 (H)  BUN Latest Ref Range: 6 - 23 mg/dL 21  Creatinine Latest Ref Range: 0.40 - 1.50 mg/dL 619  Calcium Latest Ref Range: 8.4 - 10.5 mg/dL 9.7  GFR Latest Ref Range: >60.00 mL/min 70.16    ASSESSMENT / PLAN / RECOMMENDATIONS:   1) Type 2 Diabetes Mellitus, Optimally controlled, With Hx of retinopathic   complications ( resolved by 02/2020) - Most recent A1c of 7.0%. Goal A1c < 7.0 %.    - Praised pt on Improved glycemic control and weight loss  - A1c down from 7.6 %  -Due to his history of pancreatitis secondary to Januvia, DPP- 4 inhibitors and GLP-1 agonists are contraindicated  - Noted with fasting hypoglycemia, he attributes this to wine intake, will reduce insulin  as below     MEDICATIONS: - Decrease  Tresiba to 26 units daily  - Continue  Farxiga 10 mg daily with Breakfast  - Continue Metformin 1000 mg Twice daily with Breakfast and supper  - Continue Pioglitazone 15 mg daily   EDUCATION / INSTRUCTIONS: BG monitoring instructions: Patient is instructed to check his blood sugars 1 times a day Call [facility] if: BG persistently < 70  I reviewed the Rule of 15 for the treatment of hypoglycemia in detail with the patient. Literature supplied.     2) Diabetic complications:  Eye: Did have mild DR per pt but this has resolved by 02/2020 exam. Neuro/ Feet: Does not have known diabetic peripheral neuropathy .  Renal: Patient does not have known baseline CKD. He   is on an ACEI/ARB at present.  3) Dyslipidemia :  LDL was above goal at 118 mg/dL in 89/3734. We switched Simvastatin to Atorvastatin and LDL was 99 mg/dL in 01/8767 - NO change    Continue  Atorvastatin 20 mg daily     F/U in 4 months    Signed electronically by: Lyndle Herrlich, MD  [facility]  [facility] [address] [place], [place] [address] Phone: [phone] FAX: [phone]   CC: Renford Dills, MD [address] [place] [address] Phone: [phone]  Fax: [phone]  Return to Endocrinology clinic as below: No future appointments.

## 2021-09-12 ENCOUNTER — Ambulatory Visit: Payer: BC Managed Care – PPO | Admitting: Internal Medicine

## 2021-09-12 ENCOUNTER — Other Ambulatory Visit: Payer: Self-pay

## 2021-09-12 ENCOUNTER — Encounter: Payer: Self-pay | Admitting: Internal Medicine

## 2021-09-12 VITALS — BP 136/84 | HR 87 | Ht 70.0 in | Wt 193.2 lb

## 2021-09-12 DIAGNOSIS — E785 Hyperlipidemia, unspecified: Secondary | ICD-10-CM

## 2021-09-12 DIAGNOSIS — E113299 Type 2 diabetes mellitus with mild nonproliferative diabetic retinopathy without macular edema, unspecified eye: Secondary | ICD-10-CM | POA: Diagnosis not present

## 2021-09-12 DIAGNOSIS — Z794 Long term (current) use of insulin: Secondary | ICD-10-CM | POA: Diagnosis not present

## 2021-09-12 LAB — POCT GLYCOSYLATED HEMOGLOBIN (HGB A1C): Hemoglobin A1C: 7 % — AB (ref 4.0–5.6)

## 2021-09-12 MED ORDER — PIOGLITAZONE HCL 15 MG PO TABS: 15.0000 mg | ORAL_TABLET | Freq: Every day | ORAL | 3 refills | Status: DC

## 2021-09-12 MED ORDER — TRESIBA FLEXTOUCH 100 UNIT/ML ~~LOC~~ SOPN: 26.0000 [IU] | PEN_INJECTOR | Freq: Every day | SUBCUTANEOUS | 3 refills | Status: DC

## 2021-09-12 MED ORDER — DAPAGLIFLOZIN PROPANEDIOL 10 MG PO TABS: 10.0000 mg | ORAL_TABLET | Freq: Every day | ORAL | 3 refills | Status: DC

## 2021-09-12 MED ORDER — METFORMIN HCL 1000 MG PO TABS: 1000.0000 mg | ORAL_TABLET | Freq: Two times a day (BID) | ORAL | 3 refills | Status: DC

## 2021-09-12 NOTE — Patient Instructions (Addendum)
-   Keep up the Good Work ! - Decrease Tresiba to 26 units daily  - Continue  Farxiga 10 mg daily with Breakfast  - Continue Metformin 1000 mg Twice daily with Breakfast and supper   -Continue Pioglitazone 15 mg , 1 tablet daily     HOW TO TREAT LOW BLOOD SUGARS (Blood sugar LESS THAN 70 MG/DL) Please follow the RULE OF 15 for the treatment of hypoglycemia treatment (when your (blood sugars are less than 70 mg/dL)   STEP 1: Take 15 grams of carbohydrates when your blood sugar is low, which includes:  3-4 GLUCOSE TABS  OR 3-4 OZ OF JUICE OR REGULAR SODA OR ONE TUBE OF GLUCOSE GEL    STEP 2: RECHECK blood sugar in 15 MINUTES STEP 3: If your blood sugar is still low at the 15 minute recheck --> then, go back to STEP 1 and treat AGAIN with another 15 grams of carbohydrates.

## 2022-03-08 ENCOUNTER — Other Ambulatory Visit: Payer: Self-pay | Admitting: Internal Medicine

## 2022-03-13 ENCOUNTER — Ambulatory Visit: Payer: Self-pay | Admitting: Internal Medicine

## 2022-04-04 ENCOUNTER — Encounter: Payer: Self-pay | Admitting: Internal Medicine

## 2022-04-04 ENCOUNTER — Ambulatory Visit (INDEPENDENT_AMBULATORY_CARE_PROVIDER_SITE_OTHER): Payer: 59 | Admitting: Internal Medicine

## 2022-04-04 VITALS — BP 126/84 | HR 75 | Ht 70.0 in | Wt 196.0 lb

## 2022-04-04 DIAGNOSIS — Z794 Long term (current) use of insulin: Secondary | ICD-10-CM

## 2022-04-04 DIAGNOSIS — E113299 Type 2 diabetes mellitus with mild nonproliferative diabetic retinopathy without macular edema, unspecified eye: Secondary | ICD-10-CM | POA: Diagnosis not present

## 2022-04-04 DIAGNOSIS — E1165 Type 2 diabetes mellitus with hyperglycemia: Secondary | ICD-10-CM

## 2022-04-04 DIAGNOSIS — E785 Hyperlipidemia, unspecified: Secondary | ICD-10-CM | POA: Diagnosis not present

## 2022-04-04 DIAGNOSIS — R739 Hyperglycemia, unspecified: Secondary | ICD-10-CM

## 2022-04-04 LAB — POCT GLYCOSYLATED HEMOGLOBIN (HGB A1C): Hemoglobin A1C: 7.2 % — AB (ref 4.0–5.6)

## 2022-04-04 LAB — BASIC METABOLIC PANEL
BUN: 16 mg/dL (ref 6–23)
CO2: 29 mEq/L (ref 19–32)
Calcium: 9.3 mg/dL (ref 8.4–10.5)
Chloride: 103 mEq/L (ref 96–112)
Creatinine, Ser: 0.98 mg/dL (ref 0.40–1.50)
GFR: 86.3 mL/min (ref >60.00)
Glucose, Bld: 98 mg/dL (ref 70–99)
Potassium: 4.2 mEq/L (ref 3.5–5.1)
Sodium: 141 mEq/L (ref 135–145)

## 2022-04-04 LAB — POCT GLUCOSE (DEVICE FOR HOME USE): Glucose Fasting, POC: 119 mg/dL — AB (ref 70–99)

## 2022-04-04 LAB — MICROALBUMIN / CREATININE URINE RATIO
Creatinine,U: 110.9 mg/dL
Microalb Creat Ratio: 0.7 mg/g (ref 0.0–30.0)
Microalb, Ur: 0.7 mg/dL (ref 0.0–1.9)

## 2022-04-04 MED ORDER — PIOGLITAZONE HCL 30 MG PO TABS: 30.0000 mg | ORAL_TABLET | Freq: Every day | ORAL | 3 refills | Status: DC

## 2022-04-04 NOTE — Progress Notes (Signed)
? ?Name: [name]  ?Age/ Sex: 56 y.o., male   ?MRN/ DOB: [id number], 1966/05/15    ? ?PCP: Renford DillsPolite, Ronald, MD   ?Reason for Endocrinology Evaluation: Type 2 Diabetes Mellitus  ?Initial Endocrine Consultative Visit: 01/06/2020  ? ? ?PATIENT IDENTIFIER: [name] is a 56 y.o. male with a past medical history of T2DM, Dyslipidemia and Hx of Pancreatitis ( ? Januvia) in 2016 and HTN. The patient has followed with Endocrinology clinic since 01/06/2020 for consultative assistance with management of his diabetes. ? ?DIABETIC HISTORY:   ?[name] was diagnosed with DM in 1996.  Was initially on metformin until ~ 2010 when he started taking consistently , Glipizide added later. Januvia (Pancreatitis). His hemoglobin A1c has ranged from 7.3% in 04/2019, peaking at 8.5 % in 10/2019 ? ? ? ?Started Pioglitazone 04/2021 ? ?SUBJECTIVE:  ? ?During the last visit (09/12/2021) : A1c 7.0%, continued Tresiba, Farxiga and metformin.  ? ? ? ?Today (04/04/2022): [name] is here for a follow up on diabetes management.  He checks his blood sugars 1 times every other day  The patient has not had hypoglycemic episodes since the last clinic visit.  ? ?Denies nausea or vomiting  ?Has chronic loose stools  ? ? ? ? ?HOME DIABETES REGIMEN:  ?Tresiba 26 units daily ?Farxiga 10 mg daily ?Metformin 1000 mg twice daily ?Pioglitazone 15 mg daily  ? ?  ? ?GLUCOSE LOG: Did not bring  ? ? ? ?DIABETIC COMPLICATIONS: ?Microvascular complications:  ?Retinopathy (minimal)  ?Denies: CKD , neuropathy ?Last eye exam: Completed 2023 ?  ? ?Macrovascular complications:  ?  ?Denies: CAD, PVD, CVA ?  ? ?HISTORY:  ?Past Medical History:  ?Past Medical History:  ?Diagnosis Date  ? Diabetes mellitus without complication (HCC)   ? Hypertension   ? ?Past Surgical History:  ?Past Surgical History:  ?Procedure Laterality Date  ? COLONOSCOPY WITH PROPOFOL N/A 01/13/2017  ? Procedure: COLONOSCOPY WITH PROPOFOL;  Surgeon: Charolett BumpersMartin K Johnson, MD;  Location: [facility];  Service: Endoscopy;  Laterality: N/A;  ? ?Social History:  reports that he has never smoked. He has never used smokeless tobacco. He reports that he does not drink alcohol and does not use drugs. ?Family History:  ?Family History  ?Problem Relation Age of Onset  ? Diabetes Mother   ? Diabetes Father   ? ? ? ?HOME MEDICATIONS: ?Allergies as of 04/04/2022   ? ?   Reactions  ? Shellfish Allergy Itching, Other (See Comments)  ? Scratchy throat  ? Strawberry (diagnostic) Itching, Other (See Comments)  ? Scratchy throat  ? ?  ? ?  ?Medication List  ?  ? ?  ? Accurate as of April 04, 2022  7:42 AM. If you have any questions, ask your nurse or doctor.  ?  ?  ? ?  ? ?aspirin EC 81 MG tablet ?Take 81 mg by mouth daily. ?  ?atorvastatin 20 MG tablet ?Commonly known as: LIPITOR ?TAKE 1 TABLET BY MOUTH EVERY DAY ?  ?BD ULTRA-FINE PEN NEEDLES 29G X 12.7MM Misc ?Generic drug: Insulin Pen Needle ?USE AS DIRECTED E11.65 ?  ?dapagliflozin propanediol 10 MG Tabs tablet ?Commonly known as: [place]Farxiga ?Take 1 tablet (10 mg total) by mouth daily before breakfast. ?  ?lisinopril 10 MG tablet ?Commonly known as: ZESTRIL ?Take 10 mg by mouth daily. ?  ?metFORMIN 1000 MG tablet ?Commonly known as: GLUCOPHAGE ?Take 1 tablet (1,000 mg total) by mouth 2 (two) times daily with a meal. ?  ?multivitamin  with minerals Tabs tablet ?Take 1 tablet by mouth daily at 12 noon. ?  ?pioglitazone 15 MG tablet ?Commonly known as: Actos ?Take 1 tablet (15 mg total) by mouth daily. ?  ?PRILOSEC OTC PO ?Take by mouth. ?  ?Evaristo Bury FlexTouch 100 UNIT/ML FlexTouch Pen ?Generic drug: insulin degludec ?Inject 26 Units into the skin daily. ?  ?UNABLE TO FIND ?Med Name: livago meter ?  ? ?  ? ? ? ?OBJECTIVE:  ? ?Vital Signs: BP 126/84 (BP Location: Left Arm, Patient Position: Sitting, Cuff Size: Small)   Pulse 75   Ht 5\' 10"  (1.778 m)   Wt 196 lb (88.9 kg)   SpO2 99%   BMI 28.12 kg/m?   ?Wt Readings from Last 3 Encounters:  ?04/04/22 196 lb (88.9 kg)   ?09/12/21 193 lb 3.2 oz (87.6 kg)  ?05/09/21 204 lb 4 oz (92.6 kg)  ? ? ? ?Exam: ?General: Pt appears well and is in NAD  ?Lungs: Clear with good BS bilat with no rales, rhonchi, or wheezes  ?Heart: RRR with normal S1 and S2 and no gallops; no murmurs; no rub  ?Abdomen: Normoactive bowel sounds, soft, nontender, without masses or organomegaly palpable  ?Extremities: No pretibial edema. Has a tiny red mark on the left shin , looks like a bug bite   ?Neuro: MS is good with appropriate affect, pt is alert and Ox3  ? ? ?DM foot exam:   04/04/2022 ? ?The skin of the feet is intact without sores or ulcerations. ?The pedal pulses are 2+ on right and 2+ on left. ?The sensation is intact to a screening 5.07, 10 gram monofilament bilaterally ? ? ? ?DATA REVIEWED: ? ?Lab Results  ?Component Value Date  ? HGBA1C 7.0 (A) 09/12/2021  ? HGBA1C 7.6 (A) 05/09/2021  ? HGBA1C 7.3 (A) 11/08/2020  ? ? ? Latest Reference Range & Units 04/04/22 07:52  ?Sodium 135 - 145 mEq/L 141  ?Potassium 3.5 - 5.1 mEq/L 4.2  ?Chloride 96 - 112 mEq/L 103  ?CO2 19 - 32 mEq/L 29  ?Glucose 70 - 99 mg/dL 98  ?BUN 6 - 23 mg/dL 16  ?Creatinine 0.40 - 1.50 mg/dL 04/06/22  ?Calcium 8.4 - 10.5 mg/dL 9.3  ?GFR >60.00 mL/min 86.30  ?MICROALB/CREAT RATIO 0.0 - 30.0 mg/g 0.7  ? ? ? ? ?11/23/2021 ?LDL 66 ?HDL 42 ? ?ASSESSMENT / PLAN / RECOMMENDATIONS:  ? ?1) Type 2 Diabetes Mellitus, Sub-Optimally controlled, With retinopathic  complications- Most recent A1c of 7.2%. Goal A1c < 7.0 %.  ? ?- A1c stable  ?-Due to his history of pancreatitis secondary to Januvia, DPP- 4 inhibitors and GLP-1 agonists are contraindicated  ?-We discussed the possibility of decreased endogenous production of insulin given history of pancreatitis. ?-He follows a healthy lifestyle and I have encouraged him to continue with this, I will check C-peptide ?-We will increase pioglitazone as below ? ? ? ? ?MEDICATIONS: ?- Continue  Tresiba 26 units daily  ?- Continue  Farxiga 10 mg daily with Breakfast   ?- Continue Metformin 1000 mg Twice daily with Breakfast and supper  ?- Increase  Pioglitazone 30 mg daily  ? ?EDUCATION / INSTRUCTIONS: ?BG monitoring instructions: Patient is instructed to check his blood sugars 1 times a day ?Call [facility] if: BG persistently < 70  ?I reviewed the Rule of 15 for the treatment of hypoglycemia in detail with the patient. Literature supplied. ? ? ? ? ?2) Diabetic complications:  ?Eye: Did have mild DR per pt but  this has resolved by 02/2020 exam. ?Neuro/ Feet: Does not have known diabetic peripheral neuropathy .  ?Renal: Patient does not have known baseline CKD. He   is on an ACEI/ARB at present. ? ? ? ? ?3) Dyslipidemia : ? ?LDL was above goal at 118 mg/dL in 41/9379. We switched Simvastatin to Atorvastatin and LDL was 66 mg/dL in 01/4096 ?- NO change  ? ? ?Continue  Atorvastatin 20 mg daily  ? ? ? ?F/U in 4 months  ? ? ?Signed electronically by: ?Abby Raelyn Mora, MD ? ?[facility]  ?[facility] ?[address] ?[place], [place] [address] ?Phone: [phone] ?FAX: [phone] ? ? ?CC: ?Renford Dills, MD ?[address] ?[place] [address] ?Phone: [phone]  ?Fax: [phone] ? ?Return to Endocrinology clinic as below: ?No future appointments. ? ?  ? ? ?

## 2022-04-04 NOTE — Patient Instructions (Addendum)
?-   Continue  Tresiba  26 units daily  ?- Continue  Farxiga 10 mg daily with Breakfast  ?- Continue Metformin 1000 mg Twice daily with Breakfast and supper  ? -Increase  Pioglitazone 30 mg , 1 tablet daily ? ? ? ? ?HOW TO TREAT LOW BLOOD SUGARS (Blood sugar LESS THAN 70 MG/DL) ?Please follow the RULE OF 15 for the treatment of hypoglycemia treatment (when your (blood sugars are less than 70 mg/dL)  ? ?STEP 1: Take 15 grams of carbohydrates when your blood sugar is low, which includes:  ?3-4 GLUCOSE TABS  OR ?3-4 OZ OF JUICE OR REGULAR SODA OR ?ONE TUBE OF GLUCOSE GEL   ? ?STEP 2: RECHECK blood sugar in 15 MINUTES ?STEP 3: If your blood sugar is still low at the 15 minute recheck --> then, go back to STEP 1 and treat AGAIN with another 15 grams of carbohydrates. ? ?

## 2022-04-05 ENCOUNTER — Encounter: Payer: Self-pay | Admitting: Internal Medicine

## 2022-04-05 LAB — C-PEPTIDE: C-Peptide: 0.29 ng/mL — ABNORMAL LOW (ref 0.80–3.85)

## 2022-04-09 ENCOUNTER — Telehealth: Payer: Self-pay | Admitting: Internal Medicine

## 2022-04-09 NOTE — Telephone Encounter (Signed)
Left a message for the patient to discuss low C-peptide on 04/09/2022 ? ? ? ?Awaiting callback ?

## 2022-04-10 ENCOUNTER — Telehealth: Payer: Self-pay

## 2022-04-10 NOTE — Telephone Encounter (Signed)
Patient returned your call.

## 2022-04-10 NOTE — Telephone Encounter (Signed)
Spoke to the patient on 04/10/2022 at 11:15 AM ? ? ?Discussed low C-peptide this is most likely due to history of pancreatitis ? ? ?Patient understands that eventually he will need prandial insulin ? ? ?At this time with acceptable A1c no changes are required ?He was also encouraged to continue with lifestyle changes ?

## 2022-07-20 ENCOUNTER — Other Ambulatory Visit: Payer: Self-pay | Admitting: Internal Medicine

## 2022-10-08 ENCOUNTER — Ambulatory Visit (INDEPENDENT_AMBULATORY_CARE_PROVIDER_SITE_OTHER): Payer: 59 | Admitting: Internal Medicine

## 2022-10-08 ENCOUNTER — Encounter: Payer: Self-pay | Admitting: Internal Medicine

## 2022-10-08 VITALS — BP 124/86 | HR 73 | Ht 70.0 in | Wt 198.0 lb

## 2022-10-08 DIAGNOSIS — E113299 Type 2 diabetes mellitus with mild nonproliferative diabetic retinopathy without macular edema, unspecified eye: Secondary | ICD-10-CM | POA: Diagnosis not present

## 2022-10-08 DIAGNOSIS — E1165 Type 2 diabetes mellitus with hyperglycemia: Secondary | ICD-10-CM | POA: Diagnosis not present

## 2022-10-08 DIAGNOSIS — Z794 Long term (current) use of insulin: Secondary | ICD-10-CM | POA: Diagnosis not present

## 2022-10-08 LAB — POCT GLYCOSYLATED HEMOGLOBIN (HGB A1C): Hemoglobin A1C: 7.7 % — AB (ref 4.0–5.6)

## 2022-10-08 LAB — POCT GLUCOSE (DEVICE FOR HOME USE): Glucose Fasting, POC: 169 mg/dL — AB (ref 70–99)

## 2022-10-08 MED ORDER — METFORMIN HCL 1000 MG PO TABS: 1000.0000 mg | ORAL_TABLET | Freq: Two times a day (BID) | ORAL | 3 refills | Status: DC

## 2022-10-08 MED ORDER — INSULIN PEN NEEDLE 32G X 4 MM MISC: 1.0000 | Freq: Every day | 3 refills | Status: DC

## 2022-10-08 MED ORDER — DAPAGLIFLOZIN PROPANEDIOL 10 MG PO TABS: 10.0000 mg | ORAL_TABLET | Freq: Every day | ORAL | 3 refills | Status: DC

## 2022-10-08 MED ORDER — TRESIBA FLEXTOUCH 100 UNIT/ML ~~LOC~~ SOPN: 26.0000 [IU] | PEN_INJECTOR | Freq: Every day | SUBCUTANEOUS | 3 refills | Status: DC

## 2022-10-08 MED ORDER — ONETOUCH VERIO VI STRP: 1.0000 | ORAL_STRIP | Freq: Every day | 3 refills | Status: DC

## 2022-10-08 MED ORDER — PIOGLITAZONE HCL 30 MG PO TABS: 30.0000 mg | ORAL_TABLET | Freq: Every day | ORAL | 3 refills | Status: DC

## 2022-10-08 NOTE — Progress Notes (Signed)
Name: [name]  Age/ Sex: 56 y.o., male   MRN/ DOB: [id number], 1966/06/06     PCP: Seward Carol, MD   Reason for Endocrinology Evaluation: Type 2 Diabetes Mellitus  Initial Endocrine Consultative Visit: 01/06/2020    PATIENT IDENTIFIER: [name] is a 56 y.o. male with a past medical history of T2DM, Dyslipidemia and Hx of Pancreatitis ( ? Januvia) in 2016 and HTN. The patient has followed with Endocrinology clinic since 01/06/2020 for consultative assistance with management of his diabetes.  DIABETIC HISTORY:   [name] was diagnosed with DM in 1996.  Was initially on metformin until ~ 2010 when he started taking consistently , Glipizide added later. Januvia (Pancreatitis). His hemoglobin A1c has ranged from 7.3% in 04/2019, peaking at 8.5 % in 10/2019    Started Pioglitazone 04/2021   Pt had a low C-peptide 0.29 ng/mL with a concomitant serum glucose of  98 mg/dL in 03/2022  SUBJECTIVE:   During the last visit (04/04/2022) : A1c 7.2 %    Today (10/08/2022): [name] is here for a follow up on diabetes management.  He checks his blood sugars occasionally.   The patient has not had hypoglycemic episodes since the last clinic visit.   Denies nausea or vomiting  Has chronic loose stools which has been improving  Denies LE edema      HOME DIABETES REGIMEN:  Tresiba 26 units daily Farxiga 10 mg daily Metformin 1000 mg twice daily Pioglitazone 30 mg daily       GLUCOSE LOG: Did not bring      DIABETIC COMPLICATIONS: Microvascular complications:  Retinopathy (minimal)  Denies: CKD , neuropathy Last eye exam: Completed 2023    Macrovascular complications:    Denies: CAD, PVD, CVA    HISTORY:  Past Medical History:  Past Medical History:  Diagnosis Date   Diabetes mellitus without complication ([place])    Hypertension    Past Surgical History:  Past Surgical History:  Procedure Laterality Date   COLONOSCOPY WITH PROPOFOL N/A 01/13/2017    Procedure: COLONOSCOPY WITH PROPOFOL;  Surgeon: Garlan Fair, MD;  Location: [facility];  Service: Endoscopy;  Laterality: N/A;   Social History:  reports that he has never smoked. He has never used smokeless tobacco. He reports that he does not drink alcohol and does not use drugs. Family History:  Family History  Problem Relation Age of Onset   Diabetes Mother    Diabetes Father      HOME MEDICATIONS: Allergies as of 10/08/2022       Reactions   Shellfish Allergy Itching, Other (See Comments)   Scratchy throat   Strawberry (diagnostic) Itching, Other (See Comments)   Scratchy throat        Medication List        Accurate as of October 08, 2022  7:42 AM. If you have any questions, ask your nurse or doctor.          aspirin EC 81 MG tablet Take 81 mg by mouth daily.   atorvastatin 20 MG tablet Commonly known as: LIPITOR TAKE 1 TABLET BY MOUTH EVERY DAY   BD ULTRA-FINE PEN NEEDLES 29G X 12.7MM Misc Generic drug: Insulin Pen Needle USE AS DIRECTED E11.65   dapagliflozin propanediol 10 MG Tabs tablet Commonly known as: Farxiga Take 1 tablet (10 mg total) by mouth daily before breakfast.   lisinopril 10 MG tablet Commonly known as: ZESTRIL Take 10 mg by mouth daily.   metFORMIN 1000 MG tablet  Commonly known as: GLUCOPHAGE Take 1 tablet (1,000 mg total) by mouth 2 (two) times daily with a meal.   multivitamin with minerals Tabs tablet Take 1 tablet by mouth daily at 12 noon.   pioglitazone 30 MG tablet Commonly known as: Actos Take 1 tablet (30 mg total) by mouth daily.   PRILOSEC OTC PO Take by mouth.   sildenafil 20 MG tablet Commonly known as: REVATIO Take 20 mg by mouth daily.   [name] 100 UNIT/ML FlexTouch Pen Generic drug: insulin degludec Inject 26 Units into the skin daily.   UNABLE TO FIND Med Name: livago meter         OBJECTIVE:   Vital Signs: BP 124/86 (BP Location: Left Arm, Patient Position: Sitting, Cuff  Size: Small)   Pulse 73   Ht 5\' 10"  (1.778 m)   Wt 198 lb (89.8 kg)   SpO2 99%   BMI 28.41 kg/m   Wt Readings from Last 3 Encounters:  10/08/22 198 lb (89.8 kg)  04/04/22 196 lb (88.9 kg)  09/12/21 193 lb 3.2 oz (87.6 kg)     Exam: General: Pt appears well and is in NAD  Lungs: Clear with good BS bilat with no rales, rhonchi, or wheezes  Heart: RRR   Abdomen: Normoactive bowel sounds, soft, nontender, without masses or organomegaly palpable  Extremities: No pretibial edema.  Neuro: MS is good with appropriate affect, pt is alert and Ox3    DM foot exam:   04/04/2022  The skin of the feet is intact without sores or ulcerations. The pedal pulses are 2+ on right and 2+ on left. The sensation is intact to a screening 5.07, 10 gram monofilament bilaterally    DATA REVIEWED:  Lab Results  Component Value Date   HGBA1C 7.7 (A) 10/08/2022   HGBA1C 7.2 (A) 04/04/2022   HGBA1C 7.0 (A) 09/12/2021     Latest Reference Range & Units 04/04/22 07:52  Sodium 135 - 145 mEq/L 141  Potassium 3.5 - 5.1 mEq/L 4.2  Chloride 96 - 112 mEq/L 103  CO2 19 - 32 mEq/L 29  Glucose 70 - 99 mg/dL 98  BUN 6 - 23 mg/dL 16  Creatinine 0.40 - 1.50 mg/dL 0.98  Calcium 8.4 - 10.5 mg/dL 9.3  GFR >60.00 mL/min 86.30  MICROALB/CREAT RATIO 0.0 - 30.0 mg/g 0.7      11/23/2021 LDL 66 HDL 42  ASSESSMENT / PLAN / RECOMMENDATIONS:   1) Type 2 Diabetes Mellitus, Sub-Optimally controlled, With retinopathic  complications- Most recent A1c of 7.2%. Goal A1c < 7.0 %.   - A1c is trending up  - Pt has been celebrating family members birthday with dietary indiscretions -Due to his history of pancreatitis secondary to Januvia, DPP- 4 inhibitors and GLP-1 agonists are contraindicated  -Pt with low endogenous production of insulin due to low C-peptide in the setting of normal serum glucose 03/2022, he understands that in the future , he may require prandial insulin  - No changes at this  time   MEDICATIONS: - Continue  Tresiba 26 units daily  - Continue  Farxiga 10 mg daily with Breakfast  - Continue Metformin 1000 mg Twice daily with Breakfast and supper  - Continue Pioglitazone 30 mg daily   EDUCATION / INSTRUCTIONS: BG monitoring instructions: Patient is instructed to check his blood sugars 1 times a day Call [facility] if: BG persistently < 70  I reviewed the Rule of 15 for the treatment of hypoglycemia in detail with the  patient. Literature supplied.     2) Diabetic complications:  Eye: Did have mild DR per pt but this has resolved by 02/2020 exam. Neuro/ Feet: Does not have known diabetic peripheral neuropathy .  Renal: Patient does not have known baseline CKD. He   is on an ACEI/ARB at present.     3) Dyslipidemia :  LDL was above goal at 118 mg/dL in 10/2020. We switched Simvastatin to Atorvastatin and LDL was 66 mg/dL in 11/2021 - NO change    Continue  Atorvastatin 20 mg daily     F/U in 6 months    Signed electronically by: Mack Guise, MD  [facility]  [facility] [address] [place], [place] [address] Phone: [phone] FAX: [phone]   CC: Seward Carol, [address] [address] Phone: [phone]  Fax: [phone]  Return to Endocrinology clinic as below: No future appointments.

## 2022-10-08 NOTE — Patient Instructions (Addendum)
-   Continue  Tresiba  26 units daily  - Continue  Farxiga 10 mg daily with Breakfast  - Continue Metformin 1000 mg Twice daily with Breakfast and supper   -Continue Pioglitazone 30 mg , 1 tablet daily     HOW TO TREAT LOW BLOOD SUGARS (Blood sugar LESS THAN 70 MG/DL) Please follow the RULE OF 15 for the treatment of hypoglycemia treatment (when your (blood sugars are less than 70 mg/dL)   STEP 1: Take 15 grams of carbohydrates when your blood sugar is low, which includes:  3-4 GLUCOSE TABS  OR 3-4 OZ OF JUICE OR REGULAR SODA OR ONE TUBE OF GLUCOSE GEL    STEP 2: RECHECK blood sugar in 15 MINUTES STEP 3: If your blood sugar is still low at the 15 minute recheck --> then, go back to STEP 1 and treat AGAIN with another 15 grams of carbohydrates.

## 2022-10-24 ENCOUNTER — Other Ambulatory Visit: Payer: Self-pay | Admitting: Internal Medicine

## 2022-11-28 ENCOUNTER — Telehealth: Payer: Self-pay

## 2022-11-28 MED ORDER — METFORMIN HCL ER 500 MG PO TB24: 1000.0000 mg | ORAL_TABLET | Freq: Two times a day (BID) | ORAL | 2 refills | Status: DC

## 2022-11-28 NOTE — Telephone Encounter (Signed)
Done

## 2022-11-28 NOTE — Telephone Encounter (Signed)
Patient would like to know if his Metformin can be switched to the extended release.

## 2023-02-03 ENCOUNTER — Other Ambulatory Visit: Payer: Self-pay | Admitting: Internal Medicine

## 2023-02-27 ENCOUNTER — Other Ambulatory Visit: Payer: Self-pay | Admitting: Internal Medicine

## 2023-02-27 DIAGNOSIS — R0989 Other specified symptoms and signs involving the circulatory and respiratory systems: Secondary | ICD-10-CM

## 2023-03-25 ENCOUNTER — Encounter: Payer: Self-pay | Admitting: Internal Medicine

## 2023-03-25 ENCOUNTER — Ambulatory Visit (INDEPENDENT_AMBULATORY_CARE_PROVIDER_SITE_OTHER): Payer: PRIVATE HEALTH INSURANCE | Admitting: Internal Medicine

## 2023-03-25 VITALS — BP 120/82 | HR 90 | Ht 70.0 in | Wt 195.4 lb

## 2023-03-25 DIAGNOSIS — Z794 Long term (current) use of insulin: Secondary | ICD-10-CM | POA: Diagnosis not present

## 2023-03-25 DIAGNOSIS — E113299 Type 2 diabetes mellitus with mild nonproliferative diabetic retinopathy without macular edema, unspecified eye: Secondary | ICD-10-CM

## 2023-03-25 DIAGNOSIS — E1165 Type 2 diabetes mellitus with hyperglycemia: Secondary | ICD-10-CM | POA: Diagnosis not present

## 2023-03-25 DIAGNOSIS — E785 Hyperlipidemia, unspecified: Secondary | ICD-10-CM

## 2023-03-25 LAB — POCT GLUCOSE (DEVICE FOR HOME USE): POC Glucose: 136 mg/dl — AB (ref 70–99)

## 2023-03-25 LAB — POCT GLYCOSYLATED HEMOGLOBIN (HGB A1C): Hemoglobin A1C: 7.5 % — AB (ref 4.0–5.6)

## 2023-03-25 MED ORDER — INSULIN PEN NEEDLE 32G X 4 MM MISC: 1.0000 | Freq: Every day | 3 refills | Status: DC

## 2023-03-25 MED ORDER — LANTUS SOLOSTAR 100 UNIT/ML ~~LOC~~ SOPN: 26.0000 [IU] | PEN_INJECTOR | Freq: Every day | SUBCUTANEOUS | 3 refills | Status: DC

## 2023-03-25 MED ORDER — ATORVASTATIN CALCIUM 20 MG PO TABS: 20.0000 mg | ORAL_TABLET | Freq: Every day | ORAL | 3 refills | Status: DC

## 2023-03-25 MED ORDER — DAPAGLIFLOZIN PROPANEDIOL 10 MG PO TABS: 10.0000 mg | ORAL_TABLET | Freq: Every day | ORAL | 3 refills | Status: DC

## 2023-03-25 MED ORDER — PIOGLITAZONE HCL 45 MG PO TABS: 45.0000 mg | ORAL_TABLET | Freq: Every day | ORAL | 3 refills | Status: DC

## 2023-03-25 NOTE — Progress Notes (Signed)
Name: Dinah BeersDavid Spychalski  Age/ Sex: 57 y.o., male   MRN/ DOB: [id number], 1966-09-04     PCP: Renford DillsPolite, Ronald, MD   Reason for Endocrinology Evaluation: Type 2 Diabetes Mellitus  Initial Endocrine Consultative Visit: 01/06/2020    PATIENT IDENTIFIER: Mr. Dinah BeersDavid Kahan is a 57 y.o. male with a past medical history of T2DM, Dyslipidemia and Hx of Pancreatitis ( ? Januvia) in 2016 and HTN. The patient has followed with Endocrinology clinic since 01/06/2020 for consultative assistance with management of his diabetes.  DIABETIC HISTORY:   Mr. Luan PullingDunlap was diagnosed with DM in 1996.  Was initially on metformin until ~ 2010 when he started taking consistently , Glipizide added later. Januvia (Pancreatitis). His hemoglobin A1c has ranged from 7.3% in 04/2019, peaking at 8.5 % in 10/2019  Started Pioglitazone 04/2021   Pt had a low C-peptide 0.29 ng/mL with a concomitant serum glucose of  98 mg/dL in 4/09814/2023  SUBJECTIVE:   During the last visit (10/08/2022) : A1c 7.7 %   Today (03/25/2023): Mr. Luan PullingDunlap is here for a follow up on diabetes management.  He checks his blood sugars occasionally.   The patient has not had hypoglycemic episodes since the last clinic visit.    Loose stools have resolved  Denies LE edema  He has changed his job and has new insurance, Marcelline DeistFarxiga is cost prohibitive  Tresiba not on the formulary     HOME DIABETES REGIMEN:  Evaristo Buryresiba 26 units daily Farxiga 10 mg daily Metformin 1000 mg twice daily Pioglitazone 30 mg daily       GLUCOSE LOG: Did not bring      DIABETIC COMPLICATIONS: Microvascular complications:  Retinopathy (minimal)  Denies: CKD , neuropathy Last eye exam: Completed 2023    Macrovascular complications:    Denies: CAD, PVD, CVA    HISTORY:  Past Medical History:  Past Medical History:  Diagnosis Date   Diabetes mellitus without complication    Hypertension    Past Surgical History:  Past Surgical History:  Procedure Laterality Date    COLONOSCOPY WITH PROPOFOL N/A 01/13/2017   Procedure: COLONOSCOPY WITH PROPOFOL;  Surgeon: Charolett BumpersMartin K Johnson, MD;  Location: [facility];  Service: Endoscopy;  Laterality: N/A;   Social History:  reports that he has never smoked. He has never used smokeless tobacco. He reports that he does not drink alcohol and does not use drugs. Family History:  Family History  Problem Relation Age of Onset   Diabetes Mother    Diabetes Father      HOME MEDICATIONS: Allergies as of 03/25/2023       Reactions   Shellfish Allergy Itching, Other (See Comments)   Scratchy throat   Strawberry (diagnostic) Itching, Other (See Comments)   Scratchy throat        Medication List        Accurate as of March 25, 2023  2:59 PM. If you have any questions, ask your nurse or doctor.          aspirin EC 81 MG tablet Take 81 mg by mouth daily.   atorvastatin 20 MG tablet Commonly known as: LIPITOR TAKE 1 TABLET BY MOUTH EVERY DAY   Farxiga 10 MG Tabs tablet Generic drug: dapagliflozin propanediol TAKE 1 TABLET BY MOUTH DAILY BEFORE BREAKFAST.   Insulin Pen Needle 32G X 4 MM Misc 1 Device by Does not apply route daily in the afternoon.   lisinopril 10 MG tablet Commonly known as: ZESTRIL Take 10 mg by mouth daily.  metFORMIN 500 MG 24 hr tablet Commonly known as: GLUCOPHAGE-XR Take 2 tablets (1,000 mg total) by mouth 2 (two) times daily with a meal.   multivitamin with minerals Tabs tablet Take 1 tablet by mouth daily at 12 noon.   OneTouch Verio test strip Generic drug: glucose blood 1 each by Other route daily in the afternoon. Use as instructed   pioglitazone 30 MG tablet Commonly known as: Actos Take 1 tablet (30 mg total) by mouth daily.   PRILOSEC OTC PO Take by mouth.   sildenafil 20 MG tablet Commonly known as: REVATIO Take 20 mg by mouth daily.   Evaristo Bury FlexTouch 100 UNIT/ML FlexTouch Pen Generic drug: insulin degludec INJECT 26 UNITS INTO THE SKIN DAILY.   UNABLE  TO FIND Med Name: livago meter         OBJECTIVE:   Vital Signs: BP 120/82 (BP Location: Left Arm, Patient Position: Sitting, Cuff Size: Large)   Pulse 90   Ht 5\' 10"  (1.778 m)   Wt 195 lb 6.4 oz (88.6 kg)   SpO2 97%   BMI 28.04 kg/m   Wt Readings from Last 3 Encounters:  03/25/23 195 lb 6.4 oz (88.6 kg)  10/08/22 198 lb (89.8 kg)  04/04/22 196 lb (88.9 kg)     Exam: General: Pt appears well and is in NAD  Lungs: Clear with good BS bilat   Heart: RRR   Abdomen: soft, nontender  Extremities: No pretibial edema.  Neuro: MS is good with appropriate affect, pt is alert and Ox3    DM foot exam:   03/25/2023  The skin of the feet is intact without sores or ulcerations. The pedal pulses are 2+ on right and 2+ on left. The sensation is intact to a screening 5.07, 10 gram monofilament bilaterally    DATA REVIEWED:  Lab Results  Component Value Date   HGBA1C 7.7 (A) 10/08/2022   HGBA1C 7.2 (A) 04/04/2022   HGBA1C 7.0 (A) 09/12/2021     Latest Reference Range & Units 04/04/22 07:52  Sodium 135 - 145 mEq/L 141  Potassium 3.5 - 5.1 mEq/L 4.2  Chloride 96 - 112 mEq/L 103  CO2 19 - 32 mEq/L 29  Glucose 70 - 99 mg/dL 98  BUN 6 - 23 mg/dL 16  Creatinine 3.01 - 6.01 mg/dL 0.93  Calcium 8.4 - 23.5 mg/dL 9.3  GFR >57.32 mL/min 86.30  MICROALB/CREAT RATIO 0.0 - 30.0 mg/g 0.7   In office BG 136 mg/dL   ASSESSMENT / PLAN / RECOMMENDATIONS:   1) Type 2 Diabetes Mellitus, Sub-Optimally controlled, With retinopathic  complications- Most recent A1c of 7.5%. Goal A1c < 7.0 %.   - A1c continues to be above goal -Due to his history of pancreatitis secondary to Januvia, DPP- 4 inhibitors and GLP-1 agonists are contraindicated  -Pt with low endogenous production of insulin due to low C-peptide in the setting of normal serum glucose 03/2022, he understands that in the future , he may require prandial insulin  -He has new insurance and Evaristo Bury is not on the formulary, switch to  Lantus -Marcelline Deist may be cost prohibitive, I have switched it to generic, he was also provided with a coupon, if unable to obtain [place], we may consider glipizide before breakfast -Will increase pioglitazone -Had labs through PCPs office, not available today  MEDICATIONS: -Switch Tresiba to Lantus 26 units daily  - Continue  Farxiga 10 mg daily with Breakfast  - Continue Metformin 1000 mg Twice daily with Breakfast and supper  -  Increase pioglitazone 45 mg daily   EDUCATION / INSTRUCTIONS: BG monitoring instructions: Patient is instructed to check his blood sugars 1 times a day Call [facility] if: BG persistently < 70  I reviewed the Rule of 15 for the treatment of hypoglycemia in detail with the patient. Literature supplied.     2) Diabetic complications:  Eye: Did have mild DR per pt but this has resolved by 02/2020 exam. Neuro/ Feet: Does not have known diabetic peripheral neuropathy .  Renal: Patient does not have known baseline CKD. He   is on an ACEI/ARB at present.     3) Dyslipidemia :  LDL was above goal at 118 mg/dL in 51/7616. We switched Simvastatin to Atorvastatin and LDL was 66 mg/dL in 06/3709 -Labs done at PCPs office, not available today -No change   Continue  Atorvastatin 20 mg daily     F/U in 6 months    Signed electronically by: Lyndle Herrlich, MD  [facility] Endocrinology  [facility] [address] [place] [place], [place] [address] Phone: [phone] FAX: [phone]   CC: Renford Dills, MD [address] [place] [address] Phone: [phone]  Fax: [phone]  Return to Endocrinology clinic as below: No future appointments.

## 2023-03-25 NOTE — Patient Instructions (Signed)
-   Switch Tresiba to Lantus  26 units daily  - Continue  Farxiga 10 mg daily with Breakfast  - Continue Metformin 1000 mg Twice daily with Breakfast and supper   -Increase Pioglitazone 45 mg , 1 tablet daily     HOW TO TREAT LOW BLOOD SUGARS (Blood sugar LESS THAN 70 MG/DL) Please follow the RULE OF 15 for the treatment of hypoglycemia treatment (when your (blood sugars are less than 70 mg/dL)   STEP 1: Take 15 grams of carbohydrates when your blood sugar is low, which includes:  3-4 GLUCOSE TABS  OR 3-4 OZ OF JUICE OR REGULAR SODA OR ONE TUBE OF GLUCOSE GEL    STEP 2: RECHECK blood sugar in 15 MINUTES STEP 3: If your blood sugar is still low at the 15 minute recheck --> then, go back to STEP 1 and treat AGAIN with another 15 grams of carbohydrates.

## 2023-04-09 ENCOUNTER — Ambulatory Visit: Payer: 59 | Admitting: Internal Medicine

## 2023-05-21 ENCOUNTER — Telehealth: Payer: Self-pay | Admitting: Internal Medicine

## 2023-05-21 NOTE — Telephone Encounter (Signed)
Patient advising that his medication is not covered by his insurance. Would like to speak to a technician about replacements.

## 2023-05-22 NOTE — Telephone Encounter (Signed)
Left message for patient to callback. Which medication is not covered.

## 2023-05-26 ENCOUNTER — Other Ambulatory Visit (HOSPITAL_COMMUNITY): Payer: Self-pay

## 2023-05-26 ENCOUNTER — Telehealth: Payer: Self-pay

## 2023-05-26 NOTE — Telephone Encounter (Signed)
Farxiga needs a PA  

## 2023-05-26 NOTE — Telephone Encounter (Signed)
Patient Advocate Encounter   Received notification from pt msgs that prior authorization is required for Farxiga 10MG   Per test claim:    Claim is successful with a $15 copay.   PA not required

## 2023-05-27 NOTE — Telephone Encounter (Signed)
Patient advised.

## 2023-07-04 ENCOUNTER — Other Ambulatory Visit: Payer: Self-pay | Admitting: Internal Medicine

## 2023-08-12 ENCOUNTER — Telehealth: Payer: Self-pay

## 2023-08-12 ENCOUNTER — Other Ambulatory Visit: Payer: Self-pay

## 2023-08-12 MED ORDER — ONETOUCH DELICA LANCETS 30G MISC: 2 refills | Status: AC

## 2023-08-12 NOTE — Telephone Encounter (Signed)
Lancets have been sent to pharmacy patient request.

## 2023-08-30 ENCOUNTER — Other Ambulatory Visit: Payer: Self-pay | Admitting: Internal Medicine

## 2023-09-24 ENCOUNTER — Encounter: Payer: Self-pay | Admitting: Internal Medicine

## 2023-09-24 ENCOUNTER — Ambulatory Visit: Payer: Self-pay | Admitting: Internal Medicine

## 2023-09-24 VITALS — BP 124/80 | HR 80 | Ht 70.0 in | Wt 197.8 lb

## 2023-09-24 DIAGNOSIS — E785 Hyperlipidemia, unspecified: Secondary | ICD-10-CM

## 2023-09-24 DIAGNOSIS — E1165 Type 2 diabetes mellitus with hyperglycemia: Secondary | ICD-10-CM

## 2023-09-24 DIAGNOSIS — Z794 Long term (current) use of insulin: Secondary | ICD-10-CM

## 2023-09-24 DIAGNOSIS — E113299 Type 2 diabetes mellitus with mild nonproliferative diabetic retinopathy without macular edema, unspecified eye: Secondary | ICD-10-CM

## 2023-09-24 LAB — POCT GLYCOSYLATED HEMOGLOBIN (HGB A1C): Hemoglobin A1C: 7.3 % — AB (ref 4.0–5.6)

## 2023-09-24 NOTE — Patient Instructions (Signed)
-   Continue Tresiba  26 units daily  - Continue  Farxiga 10 mg daily with Breakfast  - Continue Metformin 500 mg , 2 tablets twice daily   -Continue pioglitazone 45 mg , 1 tablet daily     HOW TO TREAT LOW BLOOD SUGARS (Blood sugar LESS THAN 70 MG/DL) Please follow the RULE OF 15 for the treatment of hypoglycemia treatment (when your (blood sugars are less than 70 mg/dL)   STEP 1: Take 15 grams of carbohydrates when your blood sugar is low, which includes:  3-4 GLUCOSE TABS  OR 3-4 OZ OF JUICE OR REGULAR SODA OR ONE TUBE OF GLUCOSE GEL    STEP 2: RECHECK blood sugar in 15 MINUTES STEP 3: If your blood sugar is still low at the 15 minute recheck --> then, go back to STEP 1 and treat AGAIN with another 15 grams of carbohydrates.

## 2023-09-24 NOTE — Progress Notes (Signed)
Name: [name]  Age/ Sex: 57 y.o., male   MRN/ DOB: [id number], 04-Jul-1966     PCP: Renford Dills, MD   Reason for Endocrinology Evaluation: Type 2 Diabetes Mellitus  Initial Endocrine Consultative Visit: 01/06/2020    PATIENT IDENTIFIER: [name] is a 57 y.o. male with a past medical history of T2DM, Dyslipidemia and Hx of Pancreatitis ( ? Januvia) in 2016 and HTN. The patient has followed with Endocrinology clinic since 01/06/2020 for consultative assistance with management of his diabetes.  DIABETIC HISTORY:   Mr. Tappan was diagnosed with DM in 1996.  Was initially on metformin until ~ 2010 when he started taking consistently , Glipizide added later. Januvia (Pancreatitis). His hemoglobin A1c has ranged from 7.3% in 04/2019, peaking at 8.5 % in 10/2019  Started Pioglitazone 04/2021   Pt had a low C-peptide 0.29 ng/mL with a concomitant serum glucose of  98 mg/dL in 07/2955     SUBJECTIVE:   During the last visit (03/25/2023) : A1c 7.5 %   Today (09/24/2023): [name] is here for a follow up on diabetes management.  He checks his blood sugars occasionally.   The patient has not had hypoglycemic episodes since the last clinic visit.   Denies nausea or vomiting  Denies constipation or diarrhea  Denies LE edema   He is starting to drive a semi truck    HOME DIABETES REGIMEN:  Tresiba 26 units daily Farxiga 10 mg daily Metformin 500 mg XR, 2 tabs  twice daily Pioglitazone 45 mg daily      METER DOWNLOAD SUMMARY: 9/10-10/08/2023 Fingerstick Blood Glucose Tests = 28 Average Number Tests/Day = 1 Overall Mean FS Glucose = 141 Standard Deviation = 40  BG Ranges: Low = 80 High = 270  BG Target % Results: % In target = 86 % Over target = 14 % Under target = 0  Hypoglycemic Events/30 Days: BG < 50 = 0 Episodes of symptomatic severe hypoglycemia = 0    DIABETIC COMPLICATIONS: Microvascular complications:  Retinopathy (minimal)  Denies: CKD ,  neuropathy Last eye exam: Completed 2023    Macrovascular complications:    Denies: CAD, PVD, CVA    HISTORY:  Past Medical History:  Past Medical History:  Diagnosis Date   Diabetes mellitus without complication (HCC)    Hypertension    Past Surgical History:  Past Surgical History:  Procedure Laterality Date   COLONOSCOPY WITH PROPOFOL N/A 01/13/2017   Procedure: COLONOSCOPY WITH PROPOFOL;  Surgeon: Charolett Bumpers, MD;  Location: [facility];  Service: Endoscopy;  Laterality: N/A;   Social History:  reports that he has never smoked. He has never used smokeless tobacco. He reports that he does not drink alcohol and does not use drugs. Family History:  Family History  Problem Relation Age of Onset   Diabetes Mother    Diabetes Father      HOME MEDICATIONS: Allergies as of 09/24/2023       Reactions   Shellfish Allergy Itching, Other (See Comments)   Scratchy throat   Strawberry (diagnostic) Itching, Other (See Comments)   Scratchy throat        Medication List        Accurate as of September 24, 2023 12:14 PM. If you have any questions, ask your nurse or doctor.          aspirin EC 81 MG tablet Take 81 mg by mouth daily.   atorvastatin 20 MG tablet Commonly known as: LIPITOR Take 1  tablet (20 mg total) by mouth daily.   dapagliflozin propanediol 10 MG Tabs tablet Commonly known as: Farxiga Take 1 tablet (10 mg total) by mouth daily before breakfast.   Insulin Pen Needle 32G X 4 MM Misc 1 Device by Does not apply route daily in the afternoon.   Lantus SoloStar 100 UNIT/ML Solostar Pen Generic drug: insulin glargine Inject 26 Units into the skin daily.   lisinopril 10 MG tablet Commonly known as: ZESTRIL Take 10 mg by mouth daily.   metFORMIN 500 MG 24 hr tablet Commonly known as: GLUCOPHAGE-XR Take 2 tablets (1,000 mg total) by mouth 2 (two) times daily with a meal.   metFORMIN 1000 MG tablet Commonly known as: GLUCOPHAGE TAKE 1 TABLET  (1,000 MG TOTAL) BY MOUTH 2 (TWO) TIMES DAILY WITH A MEAL.   multivitamin with minerals Tabs tablet Take 1 tablet by mouth daily at 12 noon.   OneTouch Delica Lancets 30G Misc Check blood sugar 1x daily   OneTouch Verio test strip Generic drug: glucose blood 1 each by Other route daily in the afternoon. Use as instructed   pioglitazone 45 MG tablet Commonly known as: ACTOS Take 1 tablet (45 mg total) by mouth daily.   PRILOSEC OTC PO Take by mouth.   sildenafil 20 MG tablet Commonly known as: REVATIO Take 20 mg by mouth daily.   Evaristo Bury FlexTouch 100 UNIT/ML FlexTouch Pen Generic drug: insulin degludec SMARTSIG:26 Unit(s) SUB-Q Daily   UNABLE TO FIND Med Name: livago meter         OBJECTIVE:   Vital Signs: BP 124/80 (BP Location: Left Arm, Patient Position: Sitting, Cuff Size: Large)   Pulse 80   Ht 5\' 10"  (1.778 m)   Wt 197 lb 12.8 oz (89.7 kg)   SpO2 99%   BMI 28.38 kg/m   Wt Readings from Last 3 Encounters:  09/24/23 197 lb 12.8 oz (89.7 kg)  03/25/23 195 lb 6.4 oz (88.6 kg)  10/08/22 198 lb (89.8 kg)     Exam: General: Pt appears well and is in NAD  Lungs: Clear with good BS bilat   Heart: RRR   Abdomen: soft, nontender  Extremities: No pretibial edema.  Neuro: MS is good with appropriate affect, pt is alert and Ox3    DM foot exam:   09/24/2023  The skin of the feet is intact without sores or ulcerations. The pedal pulses are 2+ on right and 2+ on left. The sensation is intact to a screening 5.07, 10 gram monofilament bilaterally    DATA REVIEWED:  Lab Results  Component Value Date   HGBA1C 7.5 (A) 03/25/2023   HGBA1C 7.7 (A) 10/08/2022   HGBA1C 7.2 (A) 04/04/2022     Latest Reference Range & Units 04/04/22 07:52  Sodium 135 - 145 mEq/L 141  Potassium 3.5 - 5.1 mEq/L 4.2  Chloride 96 - 112 mEq/L 103  CO2 19 - 32 mEq/L 29  Glucose 70 - 99 mg/dL 98  BUN 6 - 23 mg/dL 16  Creatinine 1.09 - 3.23 mg/dL 5.57  Calcium 8.4 - 32.2 mg/dL  9.3  GFR >02.54 mL/min 86.30  MICROALB/CREAT RATIO 0.0 - 30.0 mg/g 0.7    ASSESSMENT / PLAN / RECOMMENDATIONS:   1) Type 2 Diabetes Mellitus, Sub-Optimally controlled, With retinopathic  complications- Most recent A1c of 7.3%. Goal A1c < 7.0 %.   -A1c has trended down from 7.5% to 7.3% -Due to his history of pancreatitis secondary to Januvia, DPP- 4 inhibitors and GLP-1 agonists are  contraindicated  -Pt with low endogenous production of insulin due to low C-peptide in the setting of normal serum glucose 03/2022, he understands that in the future , he may require prandial insulin  -He typically gets his labs done through his PCPs office -No changes today    MEDICATIONS: -Continue Tresiba 26 units daily  -Continue  Farxiga 10 mg daily with Breakfast  -Continue Metformin 1000 mg Twice daily with Breakfast and supper  -Continue pioglitazone 45 mg daily   EDUCATION / INSTRUCTIONS: BG monitoring instructions: Patient is instructed to check his blood sugars 1 times a day Call  Endocrinology clinic if: BG persistently < 70  I reviewed the Rule of 15 for the treatment of hypoglycemia in detail with the patient. Literature supplied.     2) Diabetic complications:  Eye: Did have mild DR per pt but this has resolved by 02/2020 exam. Neuro/ Feet: Does not have known diabetic peripheral neuropathy .  Renal: Patient does not have known baseline CKD. He   is on an ACEI/ARB at present.     3) Dyslipidemia :  LDL was above goal at 118 mg/dL in 16/1096. We switched Simvastatin to Atorvastatin and LDL was 66 mg/dL in 03/5408 -Labs done at PCPs office, not available today -No change   Continue  Atorvastatin 20 mg daily     F/U in 6 months    Signed electronically by: Lyndle Herrlich, MD  [facility] Endocrinology  [facility] [address] [place] [place], [place] [address] Phone: [phone] FAX: [phone]   CC: Renford Dills, MD [address] [place] [address] Phone: [phone]  Fax: [phone]  Return to Endocrinology clinic as below: No future appointments.

## 2023-09-25 ENCOUNTER — Encounter: Payer: Self-pay | Admitting: Internal Medicine

## 2023-11-08 ENCOUNTER — Other Ambulatory Visit: Payer: Self-pay | Admitting: Internal Medicine

## 2023-11-16 ENCOUNTER — Other Ambulatory Visit: Payer: Self-pay | Admitting: Internal Medicine

## 2023-12-26 ENCOUNTER — Other Ambulatory Visit: Payer: Self-pay | Admitting: Internal Medicine

## 2023-12-26 ENCOUNTER — Other Ambulatory Visit (HOSPITAL_COMMUNITY): Payer: Self-pay | Admitting: Internal Medicine

## 2023-12-26 ENCOUNTER — Encounter (HOSPITAL_BASED_OUTPATIENT_CLINIC_OR_DEPARTMENT_OTHER): Payer: Self-pay

## 2023-12-26 ENCOUNTER — Ambulatory Visit
Admission: RE | Admit: 2023-12-26 | Discharge: 2023-12-26 | Disposition: A | Discharge status: Home / Self Care | Payer: 59 | Source: Ambulatory Visit | Attending: Internal Medicine | Admitting: Internal Medicine

## 2023-12-26 ENCOUNTER — Ambulatory Visit (HOSPITAL_BASED_OUTPATIENT_CLINIC_OR_DEPARTMENT_OTHER)
Admission: RE | Admit: 2023-12-26 | Discharge: 2023-12-26 | Disposition: A | Discharge status: Home / Self Care | Payer: 59 | Source: Ambulatory Visit | Attending: Internal Medicine | Admitting: Internal Medicine

## 2023-12-26 DIAGNOSIS — R079 Chest pain, unspecified: Secondary | ICD-10-CM | POA: Diagnosis present

## 2023-12-26 MED ORDER — IOHEXOL 350 MG/ML SOLN
80.0000 mL | Freq: Once | INTRAVENOUS | Status: AC | PRN
  Administered 2023-12-26: 80 mL via INTRAVENOUS

## 2024-02-25 ENCOUNTER — Other Ambulatory Visit: Payer: Self-pay | Admitting: Internal Medicine

## 2024-03-02 ENCOUNTER — Other Ambulatory Visit: Payer: Self-pay | Admitting: Internal Medicine

## 2024-03-04 ENCOUNTER — Other Ambulatory Visit: Payer: Self-pay | Admitting: Internal Medicine

## 2024-03-22 ENCOUNTER — Ambulatory Visit: Payer: BC Managed Care – PPO | Admitting: Internal Medicine

## 2024-03-22 ENCOUNTER — Encounter: Payer: Self-pay | Admitting: Internal Medicine

## 2024-03-22 VITALS — BP 116/74 | HR 97 | Ht 70.0 in | Wt 202.0 lb

## 2024-03-22 DIAGNOSIS — Z794 Long term (current) use of insulin: Secondary | ICD-10-CM | POA: Diagnosis not present

## 2024-03-22 DIAGNOSIS — E1165 Type 2 diabetes mellitus with hyperglycemia: Secondary | ICD-10-CM | POA: Diagnosis not present

## 2024-03-22 LAB — POCT GLYCOSYLATED HEMOGLOBIN (HGB A1C): Hemoglobin A1C: 7.4 % — AB (ref 4.0–5.6)

## 2024-03-22 LAB — POCT GLUCOSE (DEVICE FOR HOME USE): POC Glucose: 114 mg/dL — AB (ref 70–99)

## 2024-03-22 NOTE — Progress Notes (Signed)
 Name: [name]  Age/ Sex: 58 y.o., male   MRN/ DOB: [id number], [birth year]     PCP: Renford Dills, MD   Reason for Endocrinology Evaluation: Type 2 Diabetes Mellitus  Initial Endocrine Consultative Visit: 01/06/2020    PATIENT IDENTIFIER: [name] is a 58 y.o. male with a past medical history of T2DM, Dyslipidemia and Hx of Pancreatitis ( ? Januvia) in 2016 and HTN. The patient has followed with Endocrinology clinic since 01/06/2020 for consultative assistance with management of his diabetes.  DIABETIC HISTORY:   [name] was diagnosed with DM in 1996.  Was initially on metformin until ~ 2010 when he started taking consistently , Glipizide added later. Januvia (Pancreatitis). His hemoglobin A1c has ranged from 7.3% in 04/2019, peaking at 8.5 % in 10/2019  Started Pioglitazone 04/2021   Pt had a low C-peptide 0.29 ng/mL with a concomitant serum glucose of  98 mg/dL in 07/2955     SUBJECTIVE:   During the last visit (09/24/2023) : A1c 7.3 %   Today (03/22/2024): [name] is here for a follow up on diabetes management.  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 [employer]  Denies nausea or vomiting  Denies constipation or diarrhea  Minimal  LE edema    HOME DIABETES REGIMEN:  Tresiba 26 units daily Farxiga 10 mg daily Metformin 500 mg XR, 2 tabs  twice daily Pioglitazone 45 mg daily      METER DOWNLOAD SUMMARY: n/a    DIABETIC COMPLICATIONS: Microvascular complications:  Retinopathy (minimal)  Denies: CKD , neuropathy Last eye exam: Completed 2023    Macrovascular complications:    Denies: CAD, PVD, CVA    HISTORY:  Past Medical History:  Past Medical History:  Diagnosis Date   Diabetes mellitus without complication (HCC)    Hypertension    Past Surgical History:  Past Surgical History:  Procedure Laterality Date   COLONOSCOPY WITH PROPOFOL N/A 01/13/2017   Procedure:  COLONOSCOPY WITH PROPOFOL;  Surgeon: Charolett Bumpers, MD;  Location: [facility];  Service: Endoscopy;  Laterality: N/A;   Social History:  reports that he has never smoked. He has never used smokeless tobacco. He reports that he does not drink alcohol and does not use drugs. Family History:  Family History  Problem Relation Age of Onset   Diabetes Mother    Diabetes Father      HOME MEDICATIONS: Allergies as of 03/22/2024       Reactions   Shellfish Allergy Itching, Other (See Comments)   Scratchy throat   Strawberry (diagnostic) Itching, Other (See Comments)   Scratchy throat        Medication List        Accurate as of March 22, 2024 11:53 AM. If you have any questions, ask your nurse or doctor.          STOP taking these medications    PRILOSEC OTC PO Stopped by: [name] Denomme   UNABLE TO FIND Stopped by: [name]       TAKE these medications    aspirin EC 81 MG tablet Take 81 mg by mouth daily.   atorvastatin 20 MG tablet Commonly known as: LIPITOR TAKE 1 TABLET BY MOUTH EVERY DAY   dapagliflozin propanediol 10 MG Tabs tablet Commonly known as: Farxiga Take 1 tablet (10 mg total) by mouth daily before breakfast.   Insulin Pen Needle 32G X  4 MM Misc 1 Device by Does not apply route daily in the afternoon.   Lantus SoloStar 100 UNIT/ML Solostar Pen Generic drug: insulin glargine Inject 26 Units into the skin daily.   lisinopril 10 MG tablet Commonly known as: ZESTRIL TAKE 1 TABLET BY MOUTH EVERY DAY   metFORMIN 500 MG 24 hr tablet Commonly known as: GLUCOPHAGE-XR TAKE 2 TABLETS (1,000 MG TOTAL) BY MOUTH 2 (TWO) TIMES DAILY WITH A MEAL.   multivitamin with minerals Tabs tablet Take 1 tablet by mouth daily at 12 noon.   OneTouch Delica Lancets 30G Misc Check blood sugar 1x daily   OneTouch Verio test strip Generic drug: glucose blood 1 each by Other route daily in the afternoon. Use as instructed   pioglitazone 45 MG  tablet Commonly known as: ACTOS TAKE 1 TABLET BY MOUTH EVERY DAY   sildenafil 20 MG tablet Commonly known as: REVATIO Take 20 mg by mouth daily.   Evaristo Bury FlexTouch 100 UNIT/ML FlexTouch Pen Generic drug: insulin degludec INJECT 26 UNITS INTO THE SKIN DAILY.         OBJECTIVE:   Vital Signs: BP 116/74 (BP Location: Left Arm, Patient Position: Sitting, Cuff Size: Normal)   Pulse 97   Ht 5\' 10"  (1.778 m)   Wt 202 lb (91.6 kg)   SpO2 98%   BMI 28.98 kg/m   Wt Readings from Last 3 Encounters:  03/22/24 202 lb (91.6 kg)  09/24/23 197 lb 12.8 oz (89.7 kg)  03/25/23 195 lb 6.4 oz (88.6 kg)     Exam: General: Pt appears well and is in NAD  Lungs: Clear with good BS bilat   Heart: RRR   Abdomen: soft, nontender  Extremities: No pretibial edema.  Neuro: MS is good with appropriate affect, pt is alert and Ox3    DM foot exam:   09/24/2023  The skin of the feet is intact without sores or ulcerations. The pedal pulses are 2+ on right and 2+ on left. The sensation is intact to a screening 5.07, 10 gram monofilament bilaterally    DATA REVIEWED:  Lab Results  Component Value Date   HGBA1C 7.3 (A) 09/24/2023   HGBA1C 7.5 (A) 03/25/2023   HGBA1C 7.7 (A) 10/08/2022     Latest Reference Range & Units 04/04/22 07:52  Sodium 135 - 145 mEq/L 141  Potassium 3.5 - 5.1 mEq/L 4.2  Chloride 96 - 112 mEq/L 103  CO2 19 - 32 mEq/L 29  Glucose 70 - 99 mg/dL 98  BUN 6 - 23 mg/dL 16  Creatinine 1.61 - 0.96 mg/dL 0.45  Calcium 8.4 - 40.9 mg/dL 9.3  GFR >81.19 mL/min 86.30  MICROALB/CREAT RATIO 0.0 - 30.0 mg/g 0.7    ASSESSMENT / PLAN / RECOMMENDATIONS:   1) Type 2 Diabetes Mellitus, Sub-Optimally controlled, With retinopathic  complications- Most recent A1c of 7.3%. Goal A1c < 7.0 %.   -A1c continues to be above goal, patient states he has not been following healthy lifestyle but he started recently -Due to his history of pancreatitis secondary to Januvia, DPP- 4 inhibitors  and GLP-1 agonists are contraindicated  -Pt with low endogenous production of insulin due to low C-peptide in the setting of normal serum glucose 03/2022, he understands that in the future , he may require prandial insulin  -He typically gets his labs done through his PCPs office -We entertain the idea of sulfonylureas and repaglinide if needed before obtaining for prandial insulin -No changes at this time    MEDICATIONS: -  Continue Tresiba 26 units daily  -Continue  Farxiga 10 mg daily with Breakfast  -Continue Metformin 1000 mg Twice daily with Breakfast and supper  -Continue pioglitazone 45 mg daily   EDUCATION / INSTRUCTIONS: BG monitoring instructions: Patient is instructed to check his blood sugars 1 times a day Call [facility] if: BG persistently < 70  I reviewed the Rule of 15 for the treatment of hypoglycemia in detail with the patient. Literature supplied.     2) Diabetic complications:  Eye: Did have mild DR per pt but this has resolved by 02/2020 exam. Neuro/ Feet: Does not have known diabetic peripheral neuropathy .  Renal: Patient does not have known baseline CKD. He   is on an ACEI/ARB at present.     3) Dyslipidemia :  LDL was above goal at 118 mg/dL in 24/4010. We switched Simvastatin to Atorvastatin and LDL was 66 mg/dL in 27/2536 -Labs done at PCPs office, not available today -No change   Continue  Atorvastatin 20 mg daily     F/U in 6 months    Signed electronically by: Lyndle Herrlich, MD  [facility] End[facility]  [facility] [address] [place] [place], [place] [address] Phone: [phone] FAX: [phone]   CC: Renford Dills, MD [address] [place] [address] Phone: [phone]  Fax: [phone]  Return to Endocrinology clinic as below: Future Appointments  Date Time Provider Department Center  03/22/2024 12:10 PM Nuvia Hileman, Konrad Dolores, MD [facility] None

## 2024-03-22 NOTE — Patient Instructions (Addendum)
-   Continue Tresiba  26 units daily  - Continue  Farxiga 10 mg daily with Breakfast  - Continue Metformin 500 mg , 2 tablets twice daily   -Continue pioglitazone 45 mg , 1 tablet daily     HOW TO TREAT LOW BLOOD SUGARS (Blood sugar LESS THAN 70 MG/DL) Please follow the RULE OF 15 for the treatment of hypoglycemia treatment (when your (blood sugars are less than 70 mg/dL)   STEP 1: Take 15 grams of carbohydrates when your blood sugar is low, which includes:  3-4 GLUCOSE TABS  OR 3-4 OZ OF JUICE OR REGULAR SODA OR ONE TUBE OF GLUCOSE GEL    STEP 2: RECHECK blood sugar in 15 MINUTES STEP 3: If your blood sugar is still low at the 15 minute recheck --> then, go back to STEP 1 and treat AGAIN with another 15 grams of carbohydrates.

## 2024-04-01 ENCOUNTER — Other Ambulatory Visit: Payer: Self-pay | Admitting: Internal Medicine

## 2024-05-30 ENCOUNTER — Other Ambulatory Visit: Payer: Self-pay | Admitting: Internal Medicine

## 2024-08-23 ENCOUNTER — Other Ambulatory Visit: Payer: Self-pay | Admitting: Internal Medicine

## 2024-09-21 ENCOUNTER — Ambulatory Visit: Admitting: Internal Medicine

## 2024-09-21 ENCOUNTER — Other Ambulatory Visit

## 2024-09-21 VITALS — BP 150/78 | HR 76 | Ht 71.0 in | Wt 210.6 lb

## 2024-09-21 DIAGNOSIS — E785 Hyperlipidemia, unspecified: Secondary | ICD-10-CM | POA: Diagnosis not present

## 2024-09-21 DIAGNOSIS — E1165 Type 2 diabetes mellitus with hyperglycemia: Secondary | ICD-10-CM | POA: Diagnosis not present

## 2024-09-21 DIAGNOSIS — E113299 Type 2 diabetes mellitus with mild nonproliferative diabetic retinopathy without macular edema, unspecified eye: Secondary | ICD-10-CM

## 2024-09-21 DIAGNOSIS — Z794 Long term (current) use of insulin: Secondary | ICD-10-CM | POA: Diagnosis not present

## 2024-09-21 LAB — MICROALBUMIN / CREATININE URINE RATIO
Creatinine, Urine: 25 mg/dL (ref 20–320)
Microalb Creat Ratio: 12 mg/g{creat} (ref <30)
Microalb, Ur: 0.3 mg/dL

## 2024-09-21 LAB — POCT GLYCOSYLATED HEMOGLOBIN (HGB A1C): Hemoglobin A1C: 8.4 % — AB (ref 4.0–5.6)

## 2024-09-21 MED ORDER — BD PEN NEEDLE NANO 2ND GEN 32G X 4 MM MISC: 1.0000 | Freq: Every day | 3 refills | Status: DC

## 2024-09-21 MED ORDER — TRESIBA FLEXTOUCH 100 UNIT/ML ~~LOC~~ SOPN: 30.0000 [IU] | PEN_INJECTOR | Freq: Every day | SUBCUTANEOUS | 3 refills | Status: DC

## 2024-09-21 MED ORDER — DAPAGLIFLOZIN PROPANEDIOL 10 MG PO TABS: 10.0000 mg | ORAL_TABLET | Freq: Every day | ORAL | 3 refills | Status: DC

## 2024-09-21 MED ORDER — METFORMIN HCL ER 500 MG PO TB24: 1000.0000 mg | ORAL_TABLET | Freq: Two times a day (BID) | ORAL | 2 refills | Status: DC

## 2024-09-21 MED ORDER — PIOGLITAZONE HCL 45 MG PO TABS: 45.0000 mg | ORAL_TABLET | Freq: Every day | ORAL | 1 refills | Status: DC

## 2024-09-21 NOTE — Progress Notes (Signed)
 Name: [name]  Age/ Sex: 58 y.o., male   MRN/ DOB: [id number], March 31, 1966     PCP: Rexanne Ingle, MD   Reason for Endocrinology Evaluation: Type 2 Diabetes Mellitus  Initial Endocrine Consultative Visit: 01/06/2020    PATIENT IDENTIFIER: [name] is a 58 y.o. male with a past medical history of T2DM, Dyslipidemia and Hx of Pancreatitis ( ? Januvia) in 2016 and HTN. The patient has followed with Endocrinology clinic since 01/06/2020 for consultative assistance with management of his diabetes.  DIABETIC HISTORY:   [name] was diagnosed with DM in 1996.  Was initially on metformin  until ~ 2010 when he started taking consistently , Glipizide added later. Januvia (Pancreatitis). His hemoglobin A1c has ranged from 7.3% in 04/2019, peaking at 8.5 % in 10/2019  Started Pioglitazone  04/2021   Pt had a low C-peptide 0.29 ng/mL with a concomitant serum glucose of  98 mg/dL in 04/7975   SUBJECTIVE:   During the last visit (03/22/2024) : A1c 7.3 %   Today (09/21/2024): [name] is here for a follow up on diabetes management.  He checks his blood sugars 0x daily    Patient has been noted weight gain since his last visit here No nausea or vomiting  No constipation nor diarrhea  No LE edema    HOME DIABETES REGIMEN:  Tresiba  26 units daily Farxiga  10 mg daily Metformin  500 mg XR, 2 tabs  twice daily Pioglitazone  45 mg daily      METER DOWNLOAD SUMMARY: n/a    DIABETIC COMPLICATIONS: Microvascular complications:   Denies: CKD , neuropathy, DR Last eye exam: Completed 11/2023    Macrovascular complications:    Denies: CAD, PVD, CVA    HISTORY:  Past Medical History:  Past Medical History:  Diagnosis Date   Diabetes mellitus without complication (HCC)    Hypertension    Past Surgical History:  Past Surgical History:  Procedure Laterality Date   COLONOSCOPY WITH PROPOFOL  N/A 01/13/2017   Procedure: COLONOSCOPY WITH PROPOFOL ;  Surgeon: Gladis MARLA Louder,  MD;  Location: [facility];  Service: Endoscopy;  Laterality: N/A;   Social History:  reports that he has never smoked. He has never used smokeless tobacco. He reports that he does not drink alcohol and does not use drugs. Family History:  Family History  Problem Relation Age of Onset   Diabetes Mother    Diabetes Father      HOME MEDICATIONS: Allergies as of 09/21/2024       Reactions   Shellfish Allergy Itching, Other (See Comments)   Scratchy throat   Strawberry (diagnostic) Itching, Other (See Comments)   Scratchy throat        Medication List        Accurate as of September 21, 2024 11:34 AM. If you have any questions, ask your nurse or doctor.          aspirin EC 81 MG tablet Take 81 mg by mouth daily.   atorvastatin  20 MG tablet Commonly known as: LIPITOR TAKE 1 TABLET BY MOUTH EVERY DAY   BD Pen Needle Nano 2nd Gen 32G X 4 MM Misc Generic drug: Insulin  Pen Needle 1 DEVICE BY DOES NOT APPLY ROUTE DAILY IN THE AFTERNOON.   Farxiga  10 MG Tabs tablet Generic drug: dapagliflozin  propanediol TAKE 1 TABLET BY MOUTH DAILY BEFORE BREAKFAST.   Lantus  SoloStar 100 UNIT/ML Solostar Pen Generic drug: insulin  glargine Inject 26 Units into the skin daily.   lisinopril 10 MG tablet Commonly known  as: ZESTRIL TAKE 1 TABLET BY MOUTH EVERY DAY   metFORMIN  500 MG 24 hr tablet Commonly known as: GLUCOPHAGE -XR TAKE 2 TABLETS (1,000 MG TOTAL) BY MOUTH 2 (TWO) TIMES DAILY WITH A MEAL.   multivitamin with minerals Tabs tablet Take 1 tablet by mouth daily at 12 noon.   OneTouch Delica Lancets 30G Misc Check blood sugar 1x daily   OneTouch Verio test strip Generic drug: glucose blood 1 each by Other route daily in the afternoon. Use as instructed   pioglitazone  45 MG tablet Commonly known as: ACTOS  TAKE 1 TABLET BY MOUTH EVERY DAY   sildenafil 20 MG tablet Commonly known as: REVATIO Take 20 mg by mouth daily.   Tresiba  FlexTouch 100 UNIT/ML FlexTouch  Pen Generic drug: insulin  degludec INJECT 26 UNITS INTO THE SKIN DAILY.         OBJECTIVE:   Vital Signs: BP (!) 150/78 (BP Location: Right Arm, Patient Position: Sitting, Cuff Size: Normal)   Pulse 76   Ht 5' 11 (1.803 m)   Wt 210 lb 9.6 oz (95.5 kg)   SpO2 98%   BMI 29.37 kg/m   Wt Readings from Last 3 Encounters:  09/21/24 210 lb 9.6 oz (95.5 kg)  03/22/24 202 lb (91.6 kg)  09/24/23 197 lb 12.8 oz (89.7 kg)     Exam: General: Pt appears well and is in NAD  Lungs: Clear with good BS bilat   Heart: RRR   Abdomen: soft, nontender  Extremities: Trace pretibial edema.  Neuro: MS is good with appropriate affect, pt is alert and Ox3    DM foot exam:   09/21/2024  The skin of the feet is intact without sores or ulcerations. The pedal pulses are 2+ on right and 2+ on left. The sensation is intact to a screening 5.07, 10 gram monofilament bilaterally    DATA REVIEWED:  Lab Results  Component Value Date   HGBA1C 8.4 (A) 09/21/2024   HGBA1C 7.4 (A) 03/22/2024   HGBA1C 7.3 (A) 09/24/2023     Latest Reference Range & Units 04/04/22 07:52  Sodium 135 - 145 mEq/L 141  Potassium 3.5 - 5.1 mEq/L 4.2  Chloride 96 - 112 mEq/L 103  CO2 19 - 32 mEq/L 29  Glucose 70 - 99 mg/dL 98  BUN 6 - 23 mg/dL 16  Creatinine 9.59 - 8.49 mg/dL 9.01  Calcium  8.4 - 10.5 mg/dL 9.3  GFR >39.99 mL/min 86.30  MICROALB/CREAT RATIO 0.0 - 30.0 mg/g 0.7    ASSESSMENT / PLAN / RECOMMENDATIONS:   1) Type 2 Diabetes Mellitus, poorly controlled, With retinopathic  complications- Most recent A1c of 8.4%. Goal A1c < 7.0 %.   -A1c has increased from 7.3% to 8.4%, patient admits to dietary indiscretions and lack of exercise -Due to his history of pancreatitis secondary to Januvia, DPP- 4 inhibitors and GLP-1 agonists are contraindicated  -Pt with low endogenous production of insulin  due to low C-peptide in the setting of normal serum glucose 03/2022, he understands that in the future , he may  require prandial insulin   -We entertained the idea of sulfonylureas and repaglinide if needed before obtaining for prandial insulin  in the last - I have encouraged the patient to follow low-carb diet, I will increase his insulin  as below    MEDICATIONS: - Increase Tresiba  30  units daily  -Continue  Farxiga  10 mg daily with Breakfast  -Continue Metformin  1000 mg Twice daily with Breakfast and supper  -Continue pioglitazone  45 mg daily  EDUCATION / INSTRUCTIONS: BG monitoring instructions: Patient is instructed to check his blood sugars 1 times a day Call [facility] if: BG persistently < 70  I reviewed the Rule of 15 for the treatment of hypoglycemia in detail with the patient. Literature supplied.     2) Diabetic complications:  Eye: Did have mild DR per pt but this has resolved by 02/2020 exam. Neuro/ Feet: Does not have known diabetic peripheral neuropathy .  Renal: Patient does not have known baseline CKD. He   is on an ACEI/ARB at present.     3) Dyslipidemia :  -LDL was above goal at 118 mg/dL in 88/7978. We switched Simvastatin to Atorvastatin  and LDL was 66 mg/dL in 87/7977 -Labs done at PCPs office  Medication Continue  Atorvastatin  20 mg daily     F/U in 3 months    Signed electronically by: Stefano Redgie Butts, MD  [facility] Endocrinology  [facility] [address] [place] [place], [place] [address] Phone: [phone] FAX: [phone]   CC: Rexanne Ingle, MD [address] [place] [address] Phone: [phone]  Fax: [phone]  Return to Endocrinology clinic as below: Future Appointments  Date Time Provider Department Center  09/21/2024 11:50 AM Juliet Vasbinder, Donell Redgie, MD [facility] None

## 2024-09-21 NOTE — Patient Instructions (Addendum)
-   Increase Tresiba   30 units daily  - Continue  Farxiga  10 mg daily with Breakfast  - Continue Metformin  500 mg , 2 tablets twice daily   -Continue pioglitazone  45 mg , 1 tablet daily     HOW TO TREAT LOW BLOOD SUGARS (Blood sugar LESS THAN 70 MG/DL) Please follow the RULE OF 15 for the treatment of hypoglycemia treatment (when your (blood sugars are less than 70 mg/dL)   STEP 1: Take 15 grams of carbohydrates when your blood sugar is low, which includes:  3-4 GLUCOSE TABS  OR 3-4 OZ OF JUICE OR REGULAR SODA OR ONE TUBE OF GLUCOSE GEL    STEP 2: RECHECK blood sugar in 15 MINUTES STEP 3: If your blood sugar is still low at the 15 minute recheck --> then, go back to STEP 1 and treat AGAIN with another 15 grams of carbohydrates.

## 2024-09-22 ENCOUNTER — Ambulatory Visit: Payer: Self-pay | Admitting: Internal Medicine

## 2024-11-23 ENCOUNTER — Other Ambulatory Visit: Payer: Self-pay | Admitting: Internal Medicine

## 2024-12-29 NOTE — Progress Notes (Signed)
 "    Name: [name]  Age/ Sex: 59 y.o., male   MRN/ DOB: [id number], 1966/05/10     PCP: Rexanne Ingle, MD   Reason for Endocrinology Evaluation: Type 2 Diabetes Mellitus  Initial Endocrine Consultative Visit: 01/06/2020    PATIENT IDENTIFIER: [name] is a 59 y.o. male with a past medical history of T2DM, Dyslipidemia and Hx of Pancreatitis ( ? Januvia) in 2016 and HTN. The patient has followed with Endocrinology clinic since 01/06/2020 for consultative assistance with management of his diabetes.  DIABETIC HISTORY:   Mr. Kolenda was diagnosed with DM in 1996.  Was initially on metformin  until ~ 2010 when he started taking consistently , Glipizide  added later. Januvia (Pancreatitis). His hemoglobin A1c has ranged from 7.3% in 04/2019, peaking at 8.5 % in 10/2019  Started Pioglitazone  04/2021   Pt had a low C-peptide 0.29 ng/mL with a concomitant serum glucose of  98 mg/dL in 04/7975   SUBJECTIVE:   During the last visit (09/21/2024) : A1c 8.4 %   Today (12/30/2024): [name] is here for a follow up on diabetes management.  He checks his blood sugars 0x daily    Weight remains within stable range No nausea  No constipation or diarrhea  No LE edema but has noted muscle cramps    HOME DIABETES REGIMEN:  Tresiba  30 units daily Farxiga  10 mg daily Metformin  500 mg XR, 2 tabs  twice daily Pioglitazone  45 mg daily     METER DOWNLOAD SUMMARY: n/a    DIABETIC COMPLICATIONS: Microvascular complications:   Denies: CKD , neuropathy, DR Last eye exam: Completed 11/2023    Macrovascular complications:    Denies: CAD, PVD, CVA    HISTORY:  Past Medical History:  Past Medical History:  Diagnosis Date   Diabetes mellitus without complication (HCC)    Hypertension    Past Surgical History:  Past Surgical History:  Procedure Laterality Date   COLONOSCOPY WITH PROPOFOL  N/A 01/13/2017   Procedure: COLONOSCOPY WITH PROPOFOL ;  Surgeon: Gladis MARLA Louder, MD;   Location: [facility];  Service: Endoscopy;  Laterality: N/A;   Social History:  reports that he has never smoked. He has never used smokeless tobacco. He reports that he does not drink alcohol and does not use drugs. Family History:  Family History  Problem Relation Age of Onset   Diabetes Mother    Diabetes Father      HOME MEDICATIONS: Allergies as of 12/30/2024       Reactions   Shellfish Allergy Itching, Other (See Comments)   Scratchy throat   Strawberry (diagnostic) Itching, Other (See Comments)   Scratchy throat        Medication List        Accurate as of December 30, 2024  8:15 AM. If you have any questions, ask your nurse or doctor.          aspirin EC 81 MG tablet Take 81 mg by mouth daily.   atorvastatin  20 MG tablet Commonly known as: LIPITOR TAKE 1 TABLET BY MOUTH EVERY DAY   BD Pen Needle Nano 2nd Gen 32G X 4 MM Misc Generic drug: Insulin  Pen Needle 1 Device by Other route daily in the afternoon.   dapagliflozin  propanediol 10 MG Tabs tablet Commonly known as: Farxiga  Take 1 tablet (10 mg total) by mouth daily before breakfast.   lisinopril 10 MG tablet Commonly known as: ZESTRIL TAKE 1 TABLET BY MOUTH EVERY DAY   metFORMIN  500 MG 24 hr tablet  Commonly known as: GLUCOPHAGE -XR Take 2 tablets (1,000 mg total) by mouth 2 (two) times daily with a meal.   multivitamin with minerals Tabs tablet Take 1 tablet by mouth daily at 12 noon.   OneTouch Delica Lancets 30G Misc Check blood sugar 1x daily   OneTouch Verio test strip Generic drug: glucose blood 1 each by Other route daily in the afternoon. Use as instructed   pioglitazone  45 MG tablet Commonly known as: ACTOS  Take 1 tablet (45 mg total) by mouth daily.   sildenafil 20 MG tablet Commonly known as: REVATIO Take 20 mg by mouth daily.   Tresiba  FlexTouch 100 UNIT/ML FlexTouch Pen Generic drug: insulin  degludec Inject 30 Units into the skin daily.         OBJECTIVE:    Vital Signs: BP 132/82   Ht 5' 11 (1.803 m)   Wt 212 lb (96.2 kg)   BMI 29.57 kg/m   Wt Readings from Last 3 Encounters:  12/30/24 212 lb (96.2 kg)  09/21/24 210 lb 9.6 oz (95.5 kg)  03/22/24 202 lb (91.6 kg)     Exam: General: Pt appears well and is in NAD  Lungs: Clear with good BS bilat   Heart: RRR   Abdomen: soft, nontender  Extremities: Trace pretibial edema.  Neuro: MS is good with appropriate affect, pt is alert and Ox3    DM foot exam:   09/21/2024  The skin of the feet is intact without sores or ulcerations. The pedal pulses are 2+ on right and 2+ on left. The sensation is intact to a screening 5.07, 10 gram monofilament bilaterally    DATA REVIEWED:  Lab Results  Component Value Date   HGBA1C 8.4 (A) 09/21/2024   HGBA1C 7.4 (A) 03/22/2024   HGBA1C 7.3 (A) 09/24/2023    Latest Reference Range & Units 09/21/24 11:53  Microalb, Ur mg/dL 0.3  MICROALB/CREAT RATIO <30 mg/g creat 12  Creatinine, Urine 20 - 320 mg/dL 25    05/18/7973 outside labs BUN 23 Creatinine 0.980 GFR 90 HDL 36 LDL 58 Triglycerides 56 TSH 1.51   ASSESSMENT / PLAN / RECOMMENDATIONS:   1) Type 2 Diabetes Mellitus, poorly controlled, With retinopathic  complications- Most recent A1c of 8.1%. Goal A1c < 7.0 %.   -A1c slightly trended down, but continues to be above goal -Due to his history of pancreatitis secondary to Januvia, DPP- 4 inhibitors and GLP-1 agonists are contraindicated  -Pt with low endogenous production of insulin  due to low C-peptide in the setting of normal serum glucose 03/2022, he understands that in the future , he may require prandial insulin   - I will decrease pioglitazone  due to lower extremity edema - I have recommended starting glipizide , encouraged importance of taking this 15-20 minutes before the first meal of the day, cautioned against hypoglycemia - I have encouraged the patient to check glucose if not daily at least a couple times a week, prescription  for Accu-Chek guide has been sent with extra strips   MEDICATIONS:  -Start glipizide  5 mg, 1 tablet before breakfast -Decrease pioglitazone  30 mg daily -Continue metformin  500 mg, 2 tabs twice daily -Continue Farxiga  10 mg daily -Continue Tresiba  30  units daily    EDUCATION / INSTRUCTIONS: BG monitoring instructions: Patient is instructed to check his blood sugars 1 times a day Call [place] [facility] if: BG persistently < 70  I reviewed the Rule of 15 for the treatment of hypoglycemia in detail with the patient. Literature supplied.  2) Diabetic complications:  Eye: Did have mild DR per pt but this has resolved by 02/2020 exam. Neuro/ Feet: Does not have known diabetic peripheral neuropathy .  Renal: Patient does not have known baseline CKD. He   is on an ACEI/ARB at present.     3) Dyslipidemia :  -LDL was above goal at 118 mg/dL in 88/7978. We switched Simvastatin to Atorvastatin  and LDL was 66 mg/dL in 87/7977 -Labs done at PCPs office  Medication Continue  Atorvastatin  20 mg daily     F/U in 3 months    Signed electronically by: Stefano Redgie Butts, MD  [facility] Endocrin[facility]  [facility] [address] [place] [place], [place] [address] Phone: [phone] FAX: [phone]   CC: Rexanne Ingle, MD [address] [place] [address] Phone: [phone]  Fax: [phone]  Return to Endocrinology clinic as below: No future appointments.        "

## 2024-12-30 ENCOUNTER — Encounter: Payer: Self-pay | Admitting: Internal Medicine

## 2024-12-30 ENCOUNTER — Ambulatory Visit: Admitting: Internal Medicine

## 2024-12-30 VITALS — BP 132/82 | Ht 71.0 in | Wt 212.0 lb

## 2024-12-30 DIAGNOSIS — Z794 Long term (current) use of insulin: Secondary | ICD-10-CM | POA: Diagnosis not present

## 2024-12-30 DIAGNOSIS — E1165 Type 2 diabetes mellitus with hyperglycemia: Secondary | ICD-10-CM

## 2024-12-30 LAB — POCT GLYCOSYLATED HEMOGLOBIN (HGB A1C): Hemoglobin A1C: 8.1 % — AB (ref 4.0–5.6)

## 2024-12-30 MED ORDER — METFORMIN HCL ER 500 MG PO TB24: 1000.0000 mg | ORAL_TABLET | Freq: Two times a day (BID) | ORAL | 2 refills | Status: AC

## 2024-12-30 MED ORDER — BD PEN NEEDLE NANO 2ND GEN 32G X 4 MM MISC: 1.0000 | Freq: Every day | 3 refills | Status: AC

## 2024-12-30 MED ORDER — ACCU-CHEK GUIDE W/DEVICE KIT: 1.0000 | PACK | Freq: Every day | 0 refills | Status: AC

## 2024-12-30 MED ORDER — PIOGLITAZONE HCL 30 MG PO TABS: 30.0000 mg | ORAL_TABLET | Freq: Every day | ORAL | 3 refills | Status: AC

## 2024-12-30 MED ORDER — GLIPIZIDE 5 MG PO TABS: 5.0000 mg | ORAL_TABLET | Freq: Every day | ORAL | 3 refills | Status: AC

## 2024-12-30 MED ORDER — TRESIBA FLEXTOUCH 100 UNIT/ML ~~LOC~~ SOPN: 30.0000 [IU] | PEN_INJECTOR | Freq: Every day | SUBCUTANEOUS | 3 refills | Status: AC

## 2024-12-30 MED ORDER — ACCU-CHEK GUIDE TEST VI STRP: 1.0000 | ORAL_STRIP | Freq: Every day | 12 refills | Status: AC

## 2024-12-30 MED ORDER — DAPAGLIFLOZIN PROPANEDIOL 10 MG PO TABS: 10.0000 mg | ORAL_TABLET | Freq: Every day | ORAL | 3 refills | Status: AC

## 2024-12-30 NOTE — Patient Instructions (Addendum)
-   Start glipizide  5 mg, 1 tablet before breakfast -Decrease pioglitazone  (Actos ) 30 mg daily -Continue metformin  500 mg 2 tablets twice daily -Continue Farxiga  10 mg daily - Continue Tresiba  30 units daily      HOW TO TREAT LOW BLOOD SUGARS (Blood sugar LESS THAN 70 MG/DL) Please follow the RULE OF 15 for the treatment of hypoglycemia treatment (when your (blood sugars are less than 70 mg/dL)   STEP 1: Take 15 grams of carbohydrates when your blood sugar is low, which includes:  3-4 GLUCOSE TABS  OR 3-4 OZ OF JUICE OR REGULAR SODA OR ONE TUBE OF GLUCOSE GEL    STEP 2: RECHECK blood sugar in 15 MINUTES STEP 3: If your blood sugar is still low at the 15 minute recheck --> then, go back to STEP 1 and treat AGAIN with another 15 grams of carbohydrates.

## 2025-04-29 ENCOUNTER — Ambulatory Visit: Admitting: Internal Medicine
